# Patient Record
Sex: Female | Born: 1965
Health system: Southern US, Community
[De-identification: ages and names within clinical notes are randomized; demographics above are authoritative.]

## PROBLEM LIST (undated history)

## (undated) DIAGNOSIS — I10 Essential (primary) hypertension: Secondary | ICD-10-CM

## (undated) DIAGNOSIS — G4700 Insomnia, unspecified: Secondary | ICD-10-CM

## (undated) DIAGNOSIS — K219 Gastro-esophageal reflux disease without esophagitis: Secondary | ICD-10-CM

## (undated) DIAGNOSIS — N950 Postmenopausal bleeding: Secondary | ICD-10-CM

## (undated) DIAGNOSIS — E8809 Other disorders of plasma-protein metabolism, not elsewhere classified: Secondary | ICD-10-CM

## (undated) DIAGNOSIS — R7303 Prediabetes: Secondary | ICD-10-CM

## (undated) DIAGNOSIS — E782 Mixed hyperlipidemia: Secondary | ICD-10-CM

## (undated) DIAGNOSIS — N840 Polyp of corpus uteri: Secondary | ICD-10-CM

## (undated) DIAGNOSIS — D259 Leiomyoma of uterus, unspecified: Secondary | ICD-10-CM

---

## 1999-09-25 ENCOUNTER — Other Ambulatory Visit: Admission: RE | Admit: 1999-09-25 | Discharge: 1999-09-25 | Payer: Self-pay | Admitting: Obstetrics and Gynecology

## 2000-10-14 ENCOUNTER — Other Ambulatory Visit: Admission: RE | Admit: 2000-10-14 | Discharge: 2000-10-14 | Payer: Self-pay | Admitting: Obstetrics and Gynecology

## 2001-10-20 ENCOUNTER — Other Ambulatory Visit: Admission: RE | Admit: 2001-10-20 | Discharge: 2001-10-20 | Payer: Self-pay | Admitting: Obstetrics and Gynecology

## 2004-08-28 ENCOUNTER — Ambulatory Visit (HOSPITAL_COMMUNITY): Admission: RE | Admit: 2004-08-28 | Discharge: 2004-08-28 | Payer: Self-pay | Admitting: Obstetrics and Gynecology

## 2004-10-29 ENCOUNTER — Ambulatory Visit (HOSPITAL_BASED_OUTPATIENT_CLINIC_OR_DEPARTMENT_OTHER): Admission: RE | Admit: 2004-10-29 | Discharge: 2004-10-29 | Payer: Self-pay | Admitting: *Deleted

## 2004-10-29 ENCOUNTER — Encounter (INDEPENDENT_AMBULATORY_CARE_PROVIDER_SITE_OTHER): Payer: Self-pay | Admitting: Specialist

## 2004-10-29 HISTORY — PX: MAXILLARY CYST EXCISION: SHX2004

## 2005-09-05 ENCOUNTER — Ambulatory Visit (HOSPITAL_COMMUNITY): Admission: RE | Admit: 2005-09-05 | Discharge: 2005-09-05 | Payer: Self-pay | Admitting: Obstetrics and Gynecology

## 2006-09-12 ENCOUNTER — Ambulatory Visit (HOSPITAL_COMMUNITY): Admission: RE | Admit: 2006-09-12 | Discharge: 2006-09-12 | Payer: Self-pay | Admitting: Obstetrics and Gynecology

## 2007-10-07 ENCOUNTER — Ambulatory Visit (HOSPITAL_COMMUNITY): Admission: RE | Admit: 2007-10-07 | Discharge: 2007-10-07 | Payer: Self-pay | Admitting: Obstetrics and Gynecology

## 2009-10-18 ENCOUNTER — Ambulatory Visit (HOSPITAL_COMMUNITY): Admission: RE | Admit: 2009-10-18 | Discharge: 2009-10-18 | Payer: Self-pay | Admitting: Obstetrics and Gynecology

## 2011-01-28 ENCOUNTER — Other Ambulatory Visit (HOSPITAL_COMMUNITY): Payer: Self-pay | Admitting: Obstetrics and Gynecology

## 2011-01-28 DIAGNOSIS — Z1231 Encounter for screening mammogram for malignant neoplasm of breast: Secondary | ICD-10-CM

## 2011-01-31 ENCOUNTER — Ambulatory Visit (HOSPITAL_COMMUNITY)
Admission: RE | Admit: 2011-01-31 | Discharge: 2011-01-31 | Disposition: A | Discharge status: Home / Self Care | Payer: 59 | Source: Ambulatory Visit | Attending: Obstetrics and Gynecology | Admitting: Obstetrics and Gynecology

## 2011-01-31 DIAGNOSIS — Z1231 Encounter for screening mammogram for malignant neoplasm of breast: Secondary | ICD-10-CM

## 2011-04-12 NOTE — Op Note (Signed)
NAMEPARRISH, Donna Washington               ACCOUNT NO.:  [id number]   MEDICAL RECORD NO.:  [id number]          PATIENT TYPE:  AMB   LOCATION:  DSC                          FACILITY:  [facility]   PHYSICIAN:  Kathy Breach, M.D.      DATE OF BIRTH:  05/31/1966   DATE OF PROCEDURE:  10/29/2004  DATE OF DISCHARGE:                                 OPERATIVE REPORT   PREOPERATIVE DIAGNOSIS:  Right globulomaxillary cyst.   POSTOPERATIVE DIAGNOSIS:  Right globulomaxillary cyst, pending histological  confirmation.   PROCEDURE:  Excision right globulomaxillary cyst.   SURGEON:  Kathy Breach, M.D.   ANESTHESIA:  General orotracheal.   DESCRIPTION OF PROCEDURE:  With the patient under general orotracheal  anesthesia, the face was prepped and draped in a sterile fashion.  Then 1%  Xylocaine, 1:100,000 epinephrine was infiltrated superficially in the labial  sulcus to the right of the midline.  The patient presented with a cystic  mass, ballooning into the lateral and floor of the aspect of the right nasal  vestibule with palpable cyst underlying the attachment of the nasal ala to  the cheek, and positioned of a globulomaxillary cyst forming along the  fusion line between the pre-maxilla and the maxilla.   A curved incision in the height of the labial sulcus from the midline,  extending to the right was made, down through mucosa and down on through the  periosteum to the face of the maxilla. The very inferior extent of the cyst  encountered, ruptured and serous effusion fluid escaped from the cystic  mass.  The interior of the cyst was identifiable in the cyst wall and  resected off of the face of the maxilla, into the nasal vestibule and down  into the floor of the nasal vestibule. It was sharply and bluntly dissected  circumferentially and removed in toto.   Inspection of the cyst at the completion of the excision appeared to have an  entire excision of this smooth lying, interior intact cyst other than  the  ruptured area at the inferior aspect.  Hemostasis throughout the procedure  was obtained with light touch electrocautery.  Through-and-through defect  into the mucosa over the area over the piriform aperture unavoidable.  The  intranasal defect not readily closable.  The Caldwell-Luc type labial sulcus  incision was closed with deeply placed into 3-0 chromic catgut sutures.  A  cotton ball impregnated with Bacitracin was inserted in the nasal vestibule.  Nasopharynx suctioned clear of any blood.  The patient tolerated the  procedure well, taken to the recovery room in stable, general condition.  Estimated blood loss for the procedure was less than 30 cc.       JGL/MEDQ  D:  10/29/2004  T:  10/29/2004  Job:  [id number]

## 2012-04-13 ENCOUNTER — Other Ambulatory Visit (HOSPITAL_COMMUNITY): Payer: Self-pay | Admitting: Obstetrics and Gynecology

## 2012-04-13 DIAGNOSIS — Z1231 Encounter for screening mammogram for malignant neoplasm of breast: Secondary | ICD-10-CM

## 2012-04-15 ENCOUNTER — Ambulatory Visit (HOSPITAL_COMMUNITY)
Admission: RE | Admit: 2012-04-15 | Discharge: 2012-04-15 | Disposition: A | Discharge status: Home / Self Care | Payer: 59 | Source: Ambulatory Visit | Attending: Obstetrics and Gynecology | Admitting: Obstetrics and Gynecology

## 2012-04-15 DIAGNOSIS — Z1231 Encounter for screening mammogram for malignant neoplasm of breast: Secondary | ICD-10-CM | POA: Insufficient documentation

## 2012-04-23 ENCOUNTER — Other Ambulatory Visit: Payer: Self-pay | Admitting: Obstetrics and Gynecology

## 2012-04-23 DIAGNOSIS — R928 Other abnormal and inconclusive findings on diagnostic imaging of breast: Secondary | ICD-10-CM

## 2012-04-24 ENCOUNTER — Ambulatory Visit
Admission: RE | Admit: 2012-04-24 | Discharge: 2012-04-24 | Disposition: A | Discharge status: Home / Self Care | Payer: 59 | Source: Ambulatory Visit | Attending: Obstetrics and Gynecology | Admitting: Obstetrics and Gynecology

## 2012-04-24 DIAGNOSIS — R928 Other abnormal and inconclusive findings on diagnostic imaging of breast: Secondary | ICD-10-CM

## 2012-09-19 ENCOUNTER — Emergency Department (HOSPITAL_COMMUNITY): Payer: No Typology Code available for payment source

## 2012-09-19 ENCOUNTER — Encounter (HOSPITAL_COMMUNITY): Payer: Self-pay

## 2012-09-19 ENCOUNTER — Emergency Department (HOSPITAL_COMMUNITY)
Admission: EM | Admit: 2012-09-19 | Discharge: 2012-09-19 | Disposition: A | Discharge status: Home / Self Care | Payer: No Typology Code available for payment source | Attending: Emergency Medicine | Admitting: Emergency Medicine

## 2012-09-19 DIAGNOSIS — I10 Essential (primary) hypertension: Secondary | ICD-10-CM | POA: Insufficient documentation

## 2012-09-19 DIAGNOSIS — S8990XA Unspecified injury of unspecified lower leg, initial encounter: Secondary | ICD-10-CM | POA: Insufficient documentation

## 2012-09-19 DIAGNOSIS — Y9389 Activity, other specified: Secondary | ICD-10-CM | POA: Insufficient documentation

## 2012-09-19 DIAGNOSIS — Z79899 Other long term (current) drug therapy: Secondary | ICD-10-CM | POA: Insufficient documentation

## 2012-09-19 DIAGNOSIS — Z7982 Long term (current) use of aspirin: Secondary | ICD-10-CM | POA: Insufficient documentation

## 2012-09-19 DIAGNOSIS — S46909A Unspecified injury of unspecified muscle, fascia and tendon at shoulder and upper arm level, unspecified arm, initial encounter: Secondary | ICD-10-CM | POA: Insufficient documentation

## 2012-09-19 DIAGNOSIS — S4980XA Other specified injuries of shoulder and upper arm, unspecified arm, initial encounter: Secondary | ICD-10-CM | POA: Insufficient documentation

## 2012-09-19 HISTORY — DX: Essential (primary) hypertension: I10

## 2012-09-19 MED ORDER — OXYCODONE-ACETAMINOPHEN 5-325 MG PO TABS
1.0000 | ORAL_TABLET | Freq: Once | ORAL | Status: AC
  Administered 2012-09-19: 1 via ORAL
  Filled 2012-09-19: qty 1

## 2012-09-19 MED ORDER — OXYCODONE-ACETAMINOPHEN 5-325 MG PO TABS: 2.0000 | ORAL_TABLET | ORAL | Status: DC | PRN

## 2012-09-19 NOTE — Discharge Instructions (Signed)
Your x-rays do not show any broken bones or dislocations.  Use ibuprofen 600 mg every 6 hours for the next 48 hours for pain.  Use Percocet for more severe pain.  Followup with your Dr. if your symptoms.  Last more than 2-3 days.  Return for worse or uncontrolled symptoms
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
## 2012-09-19 NOTE — ED Notes (Signed)
Ice pack given to pt.
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
## 2013-04-27 ENCOUNTER — Other Ambulatory Visit (HOSPITAL_COMMUNITY): Payer: Self-pay | Admitting: Obstetrics and Gynecology

## 2013-04-27 DIAGNOSIS — Z1231 Encounter for screening mammogram for malignant neoplasm of breast: Secondary | ICD-10-CM

## 2013-05-05 ENCOUNTER — Ambulatory Visit (HOSPITAL_COMMUNITY)
Admission: RE | Admit: 2013-05-05 | Discharge: 2013-05-05 | Disposition: A | Discharge status: Home / Self Care | Payer: 59 | Source: Ambulatory Visit | Attending: Obstetrics and Gynecology | Admitting: Obstetrics and Gynecology

## 2013-05-05 DIAGNOSIS — Z1231 Encounter for screening mammogram for malignant neoplasm of breast: Secondary | ICD-10-CM | POA: Insufficient documentation

## 2014-05-25 ENCOUNTER — Other Ambulatory Visit: Payer: Self-pay | Admitting: Obstetrics and Gynecology

## 2014-05-25 DIAGNOSIS — R928 Other abnormal and inconclusive findings on diagnostic imaging of breast: Secondary | ICD-10-CM

## 2014-06-06 ENCOUNTER — Ambulatory Visit
Admission: RE | Admit: 2014-06-06 | Discharge: 2014-06-06 | Disposition: A | Discharge status: Home / Self Care | Payer: 59 | Source: Ambulatory Visit | Attending: Obstetrics and Gynecology | Admitting: Obstetrics and Gynecology

## 2014-06-06 ENCOUNTER — Encounter (INDEPENDENT_AMBULATORY_CARE_PROVIDER_SITE_OTHER): Payer: Self-pay

## 2014-06-06 DIAGNOSIS — R928 Other abnormal and inconclusive findings on diagnostic imaging of breast: Secondary | ICD-10-CM

## 2015-06-15 ENCOUNTER — Other Ambulatory Visit: Payer: Self-pay | Admitting: Obstetrics and Gynecology

## 2015-06-15 DIAGNOSIS — R928 Other abnormal and inconclusive findings on diagnostic imaging of breast: Secondary | ICD-10-CM

## 2015-06-20 ENCOUNTER — Other Ambulatory Visit: Payer: 59

## 2015-06-22 ENCOUNTER — Ambulatory Visit
Admission: RE | Admit: 2015-06-22 | Discharge: 2015-06-22 | Disposition: A | Discharge status: Home / Self Care | Payer: 59 | Source: Ambulatory Visit | Attending: Obstetrics and Gynecology | Admitting: Obstetrics and Gynecology

## 2015-06-22 DIAGNOSIS — R928 Other abnormal and inconclusive findings on diagnostic imaging of breast: Secondary | ICD-10-CM

## 2015-12-05 DIAGNOSIS — M9909 Segmental and somatic dysfunction of abdomen and other regions: Secondary | ICD-10-CM | POA: Diagnosis not present

## 2015-12-05 DIAGNOSIS — I10 Essential (primary) hypertension: Secondary | ICD-10-CM | POA: Diagnosis not present

## 2015-12-05 DIAGNOSIS — S161XXA Strain of muscle, fascia and tendon at neck level, initial encounter: Secondary | ICD-10-CM | POA: Diagnosis not present

## 2015-12-11 MED FILL — LISINOPRIL-HCTZ 20-25 MG TA: 20-25 | 90 days supply | Qty: 90 | Fill #0

## 2016-01-18 DIAGNOSIS — S161XXA Strain of muscle, fascia and tendon at neck level, initial encounter: Secondary | ICD-10-CM | POA: Diagnosis not present

## 2016-01-18 DIAGNOSIS — M9909 Segmental and somatic dysfunction of abdomen and other regions: Secondary | ICD-10-CM | POA: Diagnosis not present

## 2016-01-18 DIAGNOSIS — I10 Essential (primary) hypertension: Secondary | ICD-10-CM | POA: Diagnosis not present

## 2016-01-18 DIAGNOSIS — E785 Hyperlipidemia, unspecified: Secondary | ICD-10-CM | POA: Diagnosis not present

## 2016-01-25 MED FILL — VASCEPA 1 GM CAPSULE: 1 | 30 days supply | Qty: 120 | Fill #1

## 2016-02-29 DIAGNOSIS — I10 Essential (primary) hypertension: Secondary | ICD-10-CM | POA: Diagnosis not present

## 2016-02-29 DIAGNOSIS — M9909 Segmental and somatic dysfunction of abdomen and other regions: Secondary | ICD-10-CM | POA: Diagnosis not present

## 2016-02-29 DIAGNOSIS — S161XXA Strain of muscle, fascia and tendon at neck level, initial encounter: Secondary | ICD-10-CM | POA: Diagnosis not present

## 2016-03-20 MED FILL — VASCEPA 1 GM CAPSULE: 1 | 30 days supply | Qty: 120 | Fill #2

## 2016-03-20 MED FILL — LISINOPRIL-HCTZ 20-25 MG TA: 20-25 | 90 days supply | Qty: 90 | Fill #1

## 2016-04-24 DIAGNOSIS — I10 Essential (primary) hypertension: Secondary | ICD-10-CM | POA: Diagnosis not present

## 2016-04-24 DIAGNOSIS — M9909 Segmental and somatic dysfunction of abdomen and other regions: Secondary | ICD-10-CM | POA: Diagnosis not present

## 2016-04-24 DIAGNOSIS — S161XXA Strain of muscle, fascia and tendon at neck level, initial encounter: Secondary | ICD-10-CM | POA: Diagnosis not present

## 2016-06-20 ENCOUNTER — Other Ambulatory Visit: Payer: Self-pay | Admitting: Obstetrics and Gynecology

## 2016-06-20 DIAGNOSIS — Z1231 Encounter for screening mammogram for malignant neoplasm of breast: Secondary | ICD-10-CM

## 2016-06-21 MED FILL — VASCEPA 1 GM CAPSULE: 1 | 30 days supply | Qty: 120 | Fill #3

## 2016-06-21 MED FILL — LISINOPRIL-HCTZ 20-25 MG TA: 20-25 | 90 days supply | Qty: 90 | Fill #2

## 2016-06-25 DIAGNOSIS — M9909 Segmental and somatic dysfunction of abdomen and other regions: Secondary | ICD-10-CM | POA: Diagnosis not present

## 2016-06-25 DIAGNOSIS — E785 Hyperlipidemia, unspecified: Secondary | ICD-10-CM | POA: Diagnosis not present

## 2016-06-25 DIAGNOSIS — I10 Essential (primary) hypertension: Secondary | ICD-10-CM | POA: Diagnosis not present

## 2016-06-25 DIAGNOSIS — S161XXA Strain of muscle, fascia and tendon at neck level, initial encounter: Secondary | ICD-10-CM | POA: Diagnosis not present

## 2016-07-09 ENCOUNTER — Ambulatory Visit
Admission: RE | Admit: 2016-07-09 | Discharge: 2016-07-09 | Disposition: A | Discharge status: Home / Self Care | Payer: 59 | Source: Ambulatory Visit | Attending: Obstetrics and Gynecology | Admitting: Obstetrics and Gynecology

## 2016-07-09 DIAGNOSIS — Z1231 Encounter for screening mammogram for malignant neoplasm of breast: Secondary | ICD-10-CM | POA: Diagnosis not present

## 2016-08-23 MED FILL — VASCEPA 1 GM CAPSULE: 1 | 30 days supply | Qty: 120 | Fill #4

## 2016-09-03 DIAGNOSIS — S161XXA Strain of muscle, fascia and tendon at neck level, initial encounter: Secondary | ICD-10-CM | POA: Diagnosis not present

## 2016-09-03 DIAGNOSIS — E785 Hyperlipidemia, unspecified: Secondary | ICD-10-CM | POA: Diagnosis not present

## 2016-09-03 DIAGNOSIS — G4726 Circadian rhythm sleep disorder, shift work type: Secondary | ICD-10-CM | POA: Diagnosis not present

## 2016-09-03 DIAGNOSIS — I10 Essential (primary) hypertension: Secondary | ICD-10-CM | POA: Diagnosis not present

## 2016-09-03 DIAGNOSIS — M9909 Segmental and somatic dysfunction of abdomen and other regions: Secondary | ICD-10-CM | POA: Diagnosis not present

## 2016-09-20 MED FILL — LISINOPRIL-HCTZ 20-25 MG TA: 20-25 | 90 days supply | Qty: 90 | Fill #3

## 2016-10-10 DIAGNOSIS — Z79899 Other long term (current) drug therapy: Secondary | ICD-10-CM | POA: Diagnosis not present

## 2016-10-10 DIAGNOSIS — M9909 Segmental and somatic dysfunction of abdomen and other regions: Secondary | ICD-10-CM | POA: Diagnosis not present

## 2016-10-10 DIAGNOSIS — S161XXA Strain of muscle, fascia and tendon at neck level, initial encounter: Secondary | ICD-10-CM | POA: Diagnosis not present

## 2016-10-10 DIAGNOSIS — E785 Hyperlipidemia, unspecified: Secondary | ICD-10-CM | POA: Diagnosis not present

## 2016-10-10 DIAGNOSIS — I10 Essential (primary) hypertension: Secondary | ICD-10-CM | POA: Diagnosis not present

## 2016-10-10 DIAGNOSIS — J40 Bronchitis, not specified as acute or chronic: Secondary | ICD-10-CM | POA: Diagnosis not present

## 2016-11-20 MED FILL — VASCEPA 1 GM CAPSULE: 1 | 30 days supply | Qty: 120 | Fill #0

## 2016-11-21 DIAGNOSIS — E785 Hyperlipidemia, unspecified: Secondary | ICD-10-CM | POA: Diagnosis not present

## 2016-11-21 DIAGNOSIS — M9909 Segmental and somatic dysfunction of abdomen and other regions: Secondary | ICD-10-CM | POA: Diagnosis not present

## 2016-11-21 DIAGNOSIS — S161XXA Strain of muscle, fascia and tendon at neck level, initial encounter: Secondary | ICD-10-CM | POA: Diagnosis not present

## 2016-11-21 DIAGNOSIS — I10 Essential (primary) hypertension: Secondary | ICD-10-CM | POA: Diagnosis not present

## 2016-11-21 DIAGNOSIS — G4726 Circadian rhythm sleep disorder, shift work type: Secondary | ICD-10-CM | POA: Diagnosis not present

## 2016-12-24 MED FILL — LISINOPRIL-HCTZ 20-25 MG TA: 20-25 | 90 days supply | Qty: 90 | Fill #0

## 2017-01-16 DIAGNOSIS — M9901 Segmental and somatic dysfunction of cervical region: Secondary | ICD-10-CM | POA: Diagnosis not present

## 2017-01-16 DIAGNOSIS — Z6839 Body mass index (BMI) 39.0-39.9, adult: Secondary | ICD-10-CM | POA: Diagnosis not present

## 2017-01-16 DIAGNOSIS — I10 Essential (primary) hypertension: Secondary | ICD-10-CM | POA: Diagnosis not present

## 2017-01-16 DIAGNOSIS — S161XXS Strain of muscle, fascia and tendon at neck level, sequela: Secondary | ICD-10-CM | POA: Diagnosis not present

## 2017-01-22 MED FILL — VASCEPA 1 GM CAPSULE: 1 | 30 days supply | Qty: 120 | Fill #1

## 2017-02-25 DIAGNOSIS — I10 Essential (primary) hypertension: Secondary | ICD-10-CM | POA: Diagnosis not present

## 2017-02-25 DIAGNOSIS — E782 Mixed hyperlipidemia: Secondary | ICD-10-CM | POA: Diagnosis not present

## 2017-02-25 DIAGNOSIS — Z6838 Body mass index (BMI) 38.0-38.9, adult: Secondary | ICD-10-CM | POA: Diagnosis not present

## 2017-02-25 DIAGNOSIS — S161XXS Strain of muscle, fascia and tendon at neck level, sequela: Secondary | ICD-10-CM | POA: Diagnosis not present

## 2017-02-25 DIAGNOSIS — Z6839 Body mass index (BMI) 39.0-39.9, adult: Secondary | ICD-10-CM | POA: Diagnosis not present

## 2017-02-25 DIAGNOSIS — M9901 Segmental and somatic dysfunction of cervical region: Secondary | ICD-10-CM | POA: Diagnosis not present

## 2017-03-05 DIAGNOSIS — M9901 Segmental and somatic dysfunction of cervical region: Secondary | ICD-10-CM | POA: Diagnosis not present

## 2017-03-05 DIAGNOSIS — I10 Essential (primary) hypertension: Secondary | ICD-10-CM | POA: Diagnosis not present

## 2017-03-05 DIAGNOSIS — E782 Mixed hyperlipidemia: Secondary | ICD-10-CM | POA: Diagnosis not present

## 2017-03-05 DIAGNOSIS — Z6839 Body mass index (BMI) 39.0-39.9, adult: Secondary | ICD-10-CM | POA: Diagnosis not present

## 2017-03-05 DIAGNOSIS — S161XXS Strain of muscle, fascia and tendon at neck level, sequela: Secondary | ICD-10-CM | POA: Diagnosis not present

## 2017-03-21 MED FILL — LISINOPRIL-HCTZ 20-25 MG TA: 20-25 | 90 days supply | Qty: 90 | Fill #1

## 2017-03-21 MED FILL — VASCEPA 1 GM CAPSULE: 1 | 30 days supply | Qty: 120 | Fill #2

## 2017-05-13 MED FILL — VASCEPA 1 GM CAPSULE: 1 | 30 days supply | Qty: 120 | Fill #3

## 2017-05-29 DIAGNOSIS — F439 Reaction to severe stress, unspecified: Secondary | ICD-10-CM | POA: Diagnosis not present

## 2017-05-29 DIAGNOSIS — M9901 Segmental and somatic dysfunction of cervical region: Secondary | ICD-10-CM | POA: Diagnosis not present

## 2017-05-29 DIAGNOSIS — I10 Essential (primary) hypertension: Secondary | ICD-10-CM | POA: Diagnosis not present

## 2017-05-29 DIAGNOSIS — S161XXS Strain of muscle, fascia and tendon at neck level, sequela: Secondary | ICD-10-CM | POA: Diagnosis not present

## 2017-06-06 ENCOUNTER — Other Ambulatory Visit: Payer: Self-pay | Admitting: Obstetrics and Gynecology

## 2017-06-06 DIAGNOSIS — Z1231 Encounter for screening mammogram for malignant neoplasm of breast: Secondary | ICD-10-CM

## 2017-06-23 MED FILL — VASCEPA 1 GM CAPSULE: 1 | 30 days supply | Qty: 120 | Fill #4

## 2017-06-23 MED FILL — LISINOPRIL-HCTZ 20-25 MG TA: 20-25 | 90 days supply | Qty: 90 | Fill #2

## 2017-07-23 ENCOUNTER — Ambulatory Visit
Admission: RE | Admit: 2017-07-23 | Discharge: 2017-07-23 | Disposition: A | Discharge status: Home / Self Care | Payer: 59 | Source: Ambulatory Visit | Attending: Obstetrics and Gynecology | Admitting: Obstetrics and Gynecology

## 2017-07-23 DIAGNOSIS — Z1231 Encounter for screening mammogram for malignant neoplasm of breast: Secondary | ICD-10-CM

## 2017-08-26 DIAGNOSIS — S161XXS Strain of muscle, fascia and tendon at neck level, sequela: Secondary | ICD-10-CM | POA: Diagnosis not present

## 2017-08-26 DIAGNOSIS — M9901 Segmental and somatic dysfunction of cervical region: Secondary | ICD-10-CM | POA: Diagnosis not present

## 2017-08-26 DIAGNOSIS — I10 Essential (primary) hypertension: Secondary | ICD-10-CM | POA: Diagnosis not present

## 2017-08-26 MED FILL — VASCEPA 1 GM CAPSULE: 1 | 30 days supply | Qty: 120 | Fill #5

## 2017-09-22 MED FILL — LISINOPRIL-HCTZ 20-25 MG TA: 20-25 | 90 days supply | Qty: 90 | Fill #3

## 2017-10-14 DIAGNOSIS — Z1389 Encounter for screening for other disorder: Secondary | ICD-10-CM | POA: Diagnosis not present

## 2017-10-14 DIAGNOSIS — Z1151 Encounter for screening for human papillomavirus (HPV): Secondary | ICD-10-CM | POA: Diagnosis not present

## 2017-10-14 DIAGNOSIS — Z130 Encounter for screening for diseases of the blood and blood-forming organs and certain disorders involving the immune mechanism: Secondary | ICD-10-CM | POA: Diagnosis not present

## 2017-10-14 DIAGNOSIS — Z124 Encounter for screening for malignant neoplasm of cervix: Secondary | ICD-10-CM | POA: Diagnosis not present

## 2017-10-14 DIAGNOSIS — Z01419 Encounter for gynecological examination (general) (routine) without abnormal findings: Secondary | ICD-10-CM | POA: Diagnosis not present

## 2017-10-27 DIAGNOSIS — Z6838 Body mass index (BMI) 38.0-38.9, adult: Secondary | ICD-10-CM | POA: Diagnosis not present

## 2017-10-27 DIAGNOSIS — Z6839 Body mass index (BMI) 39.0-39.9, adult: Secondary | ICD-10-CM | POA: Diagnosis not present

## 2017-10-27 DIAGNOSIS — M9901 Segmental and somatic dysfunction of cervical region: Secondary | ICD-10-CM | POA: Diagnosis not present

## 2017-10-27 DIAGNOSIS — E782 Mixed hyperlipidemia: Secondary | ICD-10-CM | POA: Diagnosis not present

## 2017-10-27 DIAGNOSIS — S161XXS Strain of muscle, fascia and tendon at neck level, sequela: Secondary | ICD-10-CM | POA: Diagnosis not present

## 2017-10-27 DIAGNOSIS — I10 Essential (primary) hypertension: Secondary | ICD-10-CM | POA: Diagnosis not present

## 2017-11-26 MED FILL — predniSONE 20 MG TABS: 20 | 6 days supply | Qty: 21 | Fill #0

## 2017-11-26 MED FILL — HYDROCODONE-HOMATROPINE SYR: 5-1.5 | 5 days supply | Qty: 100 | Fill #0

## 2017-12-02 MED FILL — AMOXICILLIN 500 MG CAPSULE: 500 | 10 days supply | Qty: 30 | Fill #0

## 2017-12-30 DIAGNOSIS — Z6839 Body mass index (BMI) 39.0-39.9, adult: Secondary | ICD-10-CM | POA: Diagnosis not present

## 2017-12-30 DIAGNOSIS — J40 Bronchitis, not specified as acute or chronic: Secondary | ICD-10-CM | POA: Diagnosis not present

## 2017-12-30 DIAGNOSIS — Z6838 Body mass index (BMI) 38.0-38.9, adult: Secondary | ICD-10-CM | POA: Diagnosis not present

## 2017-12-30 DIAGNOSIS — I10 Essential (primary) hypertension: Secondary | ICD-10-CM | POA: Diagnosis not present

## 2017-12-30 DIAGNOSIS — M9901 Segmental and somatic dysfunction of cervical region: Secondary | ICD-10-CM | POA: Diagnosis not present

## 2017-12-30 DIAGNOSIS — E782 Mixed hyperlipidemia: Secondary | ICD-10-CM | POA: Diagnosis not present

## 2017-12-30 DIAGNOSIS — S161XXS Strain of muscle, fascia and tendon at neck level, sequela: Secondary | ICD-10-CM | POA: Diagnosis not present

## 2017-12-30 MED FILL — VASCEPA 1 GM CAPSULE: 1 | 90 days supply | Qty: 360 | Fill #0

## 2017-12-30 MED FILL — LISINOPRIL-HCTZ 20-25 MG TA: 20-25 | 90 days supply | Qty: 90 | Fill #0

## 2017-12-31 MED FILL — OSELTAMIVIR PHOSPHATE 75 MG: 75 | 5 days supply | Qty: 10 | Fill #0

## 2018-03-31 DIAGNOSIS — I10 Essential (primary) hypertension: Secondary | ICD-10-CM | POA: Diagnosis not present

## 2018-03-31 DIAGNOSIS — E782 Mixed hyperlipidemia: Secondary | ICD-10-CM | POA: Diagnosis not present

## 2018-03-31 DIAGNOSIS — J40 Bronchitis, not specified as acute or chronic: Secondary | ICD-10-CM | POA: Diagnosis not present

## 2018-03-31 DIAGNOSIS — M9901 Segmental and somatic dysfunction of cervical region: Secondary | ICD-10-CM | POA: Diagnosis not present

## 2018-04-03 MED FILL — VASCEPA 1 GM CAPSULE: 1 | 90 days supply | Qty: 360 | Fill #1

## 2018-04-03 MED FILL — LISINOPRIL-HCTZ 20-25 MG TA: 20-25 | 90 days supply | Qty: 90 | Fill #1

## 2018-04-09 DIAGNOSIS — Z1211 Encounter for screening for malignant neoplasm of colon: Secondary | ICD-10-CM | POA: Diagnosis not present

## 2018-04-09 DIAGNOSIS — Z1212 Encounter for screening for malignant neoplasm of rectum: Secondary | ICD-10-CM | POA: Diagnosis not present

## 2018-05-14 DIAGNOSIS — I10 Essential (primary) hypertension: Secondary | ICD-10-CM | POA: Diagnosis not present

## 2018-05-14 DIAGNOSIS — Z0000 Encounter for general adult medical examination without abnormal findings: Secondary | ICD-10-CM | POA: Diagnosis not present

## 2018-06-26 MED FILL — LISINOPRIL-HCTZ 20-25 MG TA: 20-25 | 90 days supply | Qty: 90 | Fill #2

## 2018-06-26 MED FILL — VASCEPA 1 GM CAPSULE: 1 | 90 days supply | Qty: 360 | Fill #2

## 2018-09-15 DIAGNOSIS — M9901 Segmental and somatic dysfunction of cervical region: Secondary | ICD-10-CM | POA: Diagnosis not present

## 2018-09-15 DIAGNOSIS — M9902 Segmental and somatic dysfunction of thoracic region: Secondary | ICD-10-CM | POA: Diagnosis not present

## 2018-09-15 DIAGNOSIS — E782 Mixed hyperlipidemia: Secondary | ICD-10-CM | POA: Diagnosis not present

## 2018-09-15 DIAGNOSIS — S161XXS Strain of muscle, fascia and tendon at neck level, sequela: Secondary | ICD-10-CM | POA: Diagnosis not present

## 2018-09-15 DIAGNOSIS — I10 Essential (primary) hypertension: Secondary | ICD-10-CM | POA: Diagnosis not present

## 2018-09-15 DIAGNOSIS — M9903 Segmental and somatic dysfunction of lumbar region: Secondary | ICD-10-CM | POA: Diagnosis not present

## 2018-10-27 MED FILL — VASCEPA 1 GM CAPSULE: 1 | 90 days supply | Qty: 360 | Fill #3

## 2018-10-27 MED FILL — LISINOPRIL-HCTZ 20-25 MG TA: 20-25 | 90 days supply | Qty: 90 | Fill #3

## 2018-12-02 DIAGNOSIS — M9902 Segmental and somatic dysfunction of thoracic region: Secondary | ICD-10-CM | POA: Diagnosis not present

## 2018-12-02 DIAGNOSIS — M9903 Segmental and somatic dysfunction of lumbar region: Secondary | ICD-10-CM | POA: Diagnosis not present

## 2018-12-02 DIAGNOSIS — M9901 Segmental and somatic dysfunction of cervical region: Secondary | ICD-10-CM | POA: Diagnosis not present

## 2018-12-02 DIAGNOSIS — E782 Mixed hyperlipidemia: Secondary | ICD-10-CM | POA: Diagnosis not present

## 2018-12-02 DIAGNOSIS — S161XXS Strain of muscle, fascia and tendon at neck level, sequela: Secondary | ICD-10-CM | POA: Diagnosis not present

## 2018-12-02 DIAGNOSIS — I10 Essential (primary) hypertension: Secondary | ICD-10-CM | POA: Diagnosis not present

## 2019-01-25 MED FILL — LISINOPRIL-HCTZ 20-25 MG TA: 20-25 | 90 days supply | Qty: 90 | Fill #0

## 2019-01-28 ENCOUNTER — Other Ambulatory Visit: Payer: Self-pay | Admitting: Obstetrics and Gynecology

## 2019-01-28 DIAGNOSIS — Z1231 Encounter for screening mammogram for malignant neoplasm of breast: Secondary | ICD-10-CM

## 2019-02-01 MED FILL — OSELTAMIVIR PHOSPHATE 75 MG: 75 | 7 days supply | Qty: 7 | Fill #0

## 2019-02-24 ENCOUNTER — Ambulatory Visit: Payer: 59

## 2019-03-04 MED FILL — VASCEPA 1 GM CAPSULE: 1 | 90 days supply | Qty: 360 | Fill #0

## 2019-05-10 ENCOUNTER — Other Ambulatory Visit: Payer: Self-pay

## 2019-05-10 ENCOUNTER — Ambulatory Visit
Admission: RE | Admit: 2019-05-10 | Discharge: 2019-05-10 | Disposition: A | Discharge status: Home / Self Care | Payer: 59 | Source: Ambulatory Visit | Attending: Obstetrics and Gynecology | Admitting: Obstetrics and Gynecology

## 2019-05-10 DIAGNOSIS — Z1231 Encounter for screening mammogram for malignant neoplasm of breast: Secondary | ICD-10-CM | POA: Diagnosis not present

## 2019-05-12 ENCOUNTER — Other Ambulatory Visit: Payer: Self-pay | Admitting: Obstetrics and Gynecology

## 2019-05-12 DIAGNOSIS — R921 Mammographic calcification found on diagnostic imaging of breast: Secondary | ICD-10-CM

## 2019-05-14 MED FILL — LISINOPRIL-HCTZ 20-25 MG TA: 20-25 | 90 days supply | Qty: 90 | Fill #1

## 2019-05-19 ENCOUNTER — Other Ambulatory Visit: Payer: Self-pay

## 2019-05-19 ENCOUNTER — Other Ambulatory Visit: Payer: Self-pay | Admitting: Obstetrics and Gynecology

## 2019-05-19 ENCOUNTER — Ambulatory Visit
Admission: RE | Admit: 2019-05-19 | Discharge: 2019-05-19 | Disposition: A | Discharge status: Home / Self Care | Payer: 59 | Source: Ambulatory Visit | Attending: Obstetrics and Gynecology | Admitting: Obstetrics and Gynecology

## 2019-05-19 DIAGNOSIS — R921 Mammographic calcification found on diagnostic imaging of breast: Secondary | ICD-10-CM

## 2019-05-25 DIAGNOSIS — I10 Essential (primary) hypertension: Secondary | ICD-10-CM | POA: Diagnosis not present

## 2019-05-25 DIAGNOSIS — M9901 Segmental and somatic dysfunction of cervical region: Secondary | ICD-10-CM | POA: Diagnosis not present

## 2019-05-25 DIAGNOSIS — S161XXS Strain of muscle, fascia and tendon at neck level, sequela: Secondary | ICD-10-CM | POA: Diagnosis not present

## 2019-05-25 DIAGNOSIS — M9902 Segmental and somatic dysfunction of thoracic region: Secondary | ICD-10-CM | POA: Diagnosis not present

## 2019-05-25 DIAGNOSIS — E782 Mixed hyperlipidemia: Secondary | ICD-10-CM | POA: Diagnosis not present

## 2019-05-25 DIAGNOSIS — M9903 Segmental and somatic dysfunction of lumbar region: Secondary | ICD-10-CM | POA: Diagnosis not present

## 2019-06-18 DIAGNOSIS — E782 Mixed hyperlipidemia: Secondary | ICD-10-CM | POA: Diagnosis not present

## 2019-06-18 DIAGNOSIS — I10 Essential (primary) hypertension: Secondary | ICD-10-CM | POA: Diagnosis not present

## 2019-08-12 MED FILL — LISINOPRIL-HCTZ 20-25 MG TA: 20-25 | 90 days supply | Qty: 90 | Fill #2

## 2019-08-12 MED FILL — VASCEPA 1 GM CAPSULE: 1 | 90 days supply | Qty: 360 | Fill #0

## 2019-09-23 DIAGNOSIS — Z130 Encounter for screening for diseases of the blood and blood-forming organs and certain disorders involving the immune mechanism: Secondary | ICD-10-CM | POA: Diagnosis not present

## 2019-09-23 DIAGNOSIS — Z1389 Encounter for screening for other disorder: Secondary | ICD-10-CM | POA: Diagnosis not present

## 2019-09-23 DIAGNOSIS — Z6841 Body Mass Index (BMI) 40.0 and over, adult: Secondary | ICD-10-CM | POA: Diagnosis not present

## 2019-09-23 DIAGNOSIS — Z01419 Encounter for gynecological examination (general) (routine) without abnormal findings: Secondary | ICD-10-CM | POA: Diagnosis not present

## 2019-10-04 MED FILL — OMRON 3 SERIES BP MONITOR D: 90 days supply | Qty: 1 | Fill #0

## 2019-10-05 DIAGNOSIS — H5203 Hypermetropia, bilateral: Secondary | ICD-10-CM | POA: Diagnosis not present

## 2019-11-22 ENCOUNTER — Other Ambulatory Visit: Payer: Self-pay | Admitting: Obstetrics and Gynecology

## 2019-11-22 ENCOUNTER — Other Ambulatory Visit: Payer: Self-pay

## 2019-11-22 ENCOUNTER — Ambulatory Visit
Admission: RE | Admit: 2019-11-22 | Discharge: 2019-11-22 | Disposition: A | Discharge status: Home / Self Care | Payer: 59 | Source: Ambulatory Visit | Attending: Obstetrics and Gynecology | Admitting: Obstetrics and Gynecology

## 2019-11-22 DIAGNOSIS — R921 Mammographic calcification found on diagnostic imaging of breast: Secondary | ICD-10-CM

## 2019-11-22 MED FILL — LISINOPRIL-HCTZ 20-25 MG TA: 20-25 | 90 days supply | Qty: 90 | Fill #3

## 2020-02-14 DIAGNOSIS — H43811 Vitreous degeneration, right eye: Secondary | ICD-10-CM | POA: Diagnosis not present

## 2020-02-28 DIAGNOSIS — H43811 Vitreous degeneration, right eye: Secondary | ICD-10-CM | POA: Diagnosis not present

## 2020-03-02 MED FILL — LISINOPRIL-HCTZ 20-25 MG TA: 20-25 | 90 days supply | Qty: 90 | Fill #0

## 2020-05-10 ENCOUNTER — Ambulatory Visit
Admission: RE | Admit: 2020-05-10 | Discharge: 2020-05-10 | Disposition: A | Discharge status: Home / Self Care | Payer: 59 | Source: Ambulatory Visit | Attending: Obstetrics and Gynecology | Admitting: Obstetrics and Gynecology

## 2020-05-10 ENCOUNTER — Other Ambulatory Visit: Payer: Self-pay

## 2020-05-10 DIAGNOSIS — R921 Mammographic calcification found on diagnostic imaging of breast: Secondary | ICD-10-CM | POA: Diagnosis not present

## 2020-05-30 MED FILL — LISINOPRIL-HCTZ 20-25 MG TA: 20-25 | 90 days supply | Qty: 90 | Fill #0

## 2020-06-07 DIAGNOSIS — Z0000 Encounter for general adult medical examination without abnormal findings: Secondary | ICD-10-CM | POA: Diagnosis not present

## 2020-06-07 DIAGNOSIS — I10 Essential (primary) hypertension: Secondary | ICD-10-CM | POA: Diagnosis not present

## 2020-06-07 DIAGNOSIS — E782 Mixed hyperlipidemia: Secondary | ICD-10-CM | POA: Diagnosis not present

## 2020-06-07 DIAGNOSIS — R7309 Other abnormal glucose: Secondary | ICD-10-CM | POA: Diagnosis not present

## 2020-08-10 MED FILL — VASCEPA 1 GM CAPSULE: 1 | 90 days supply | Qty: 360 | Fill #1

## 2020-09-04 IMAGING — MG DIGITAL DIAGNOSTIC BILAT W/ TOMO W/ CAD
6 of 10 series · 6 of 26 positions shown · non-contrast
Comparison: Previous exam(s).

CLINICAL DATA: Patient for short-term follow-up probably benign
left breast calcifications.

EXAM:
DIGITAL DIAGNOSTIC BILATERAL MAMMOGRAM WITH TOMO AND CAD

[L CC]
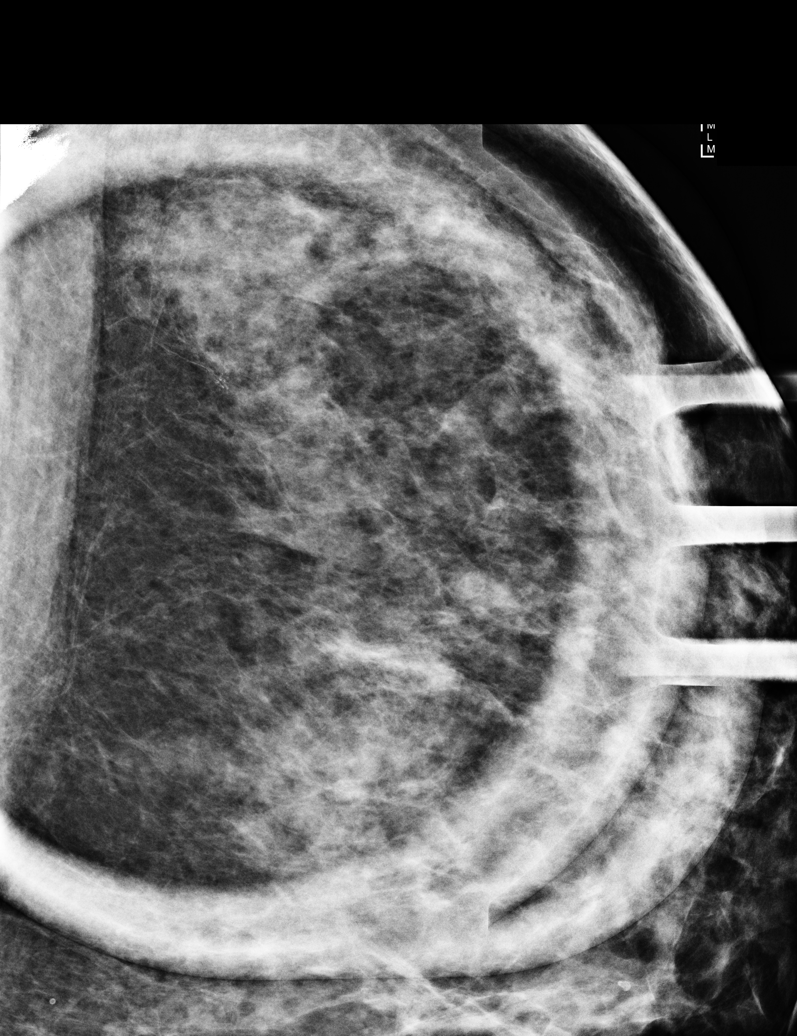

[L ML]
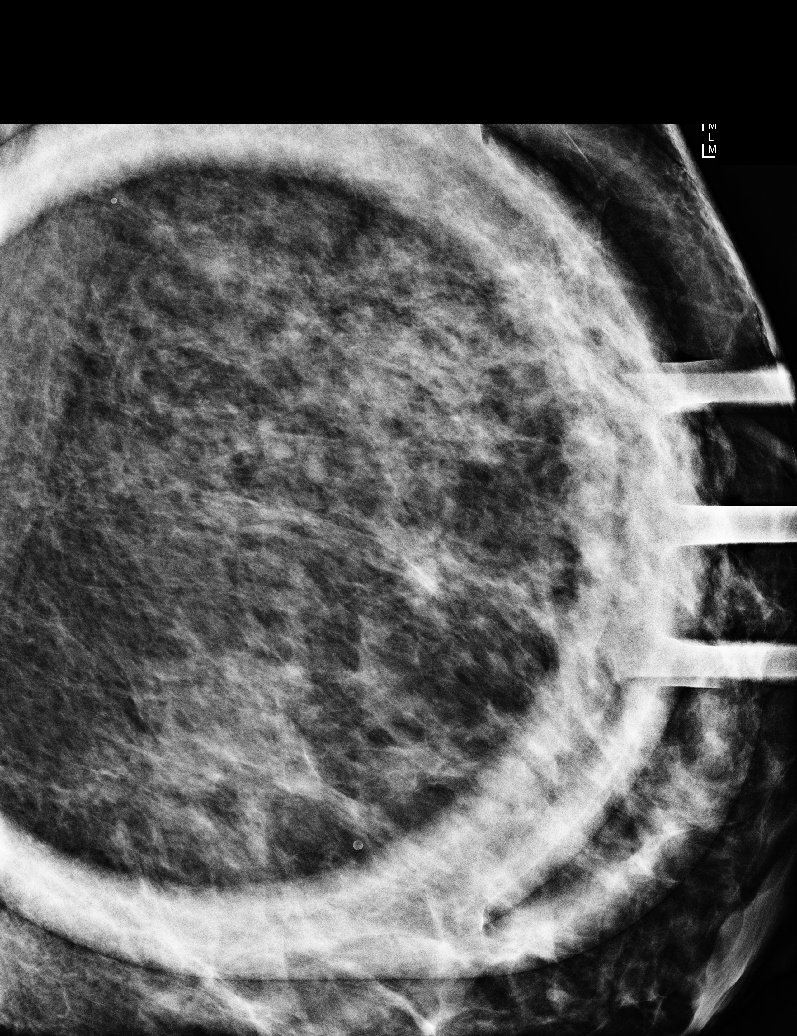

[R MLO synth-2D]
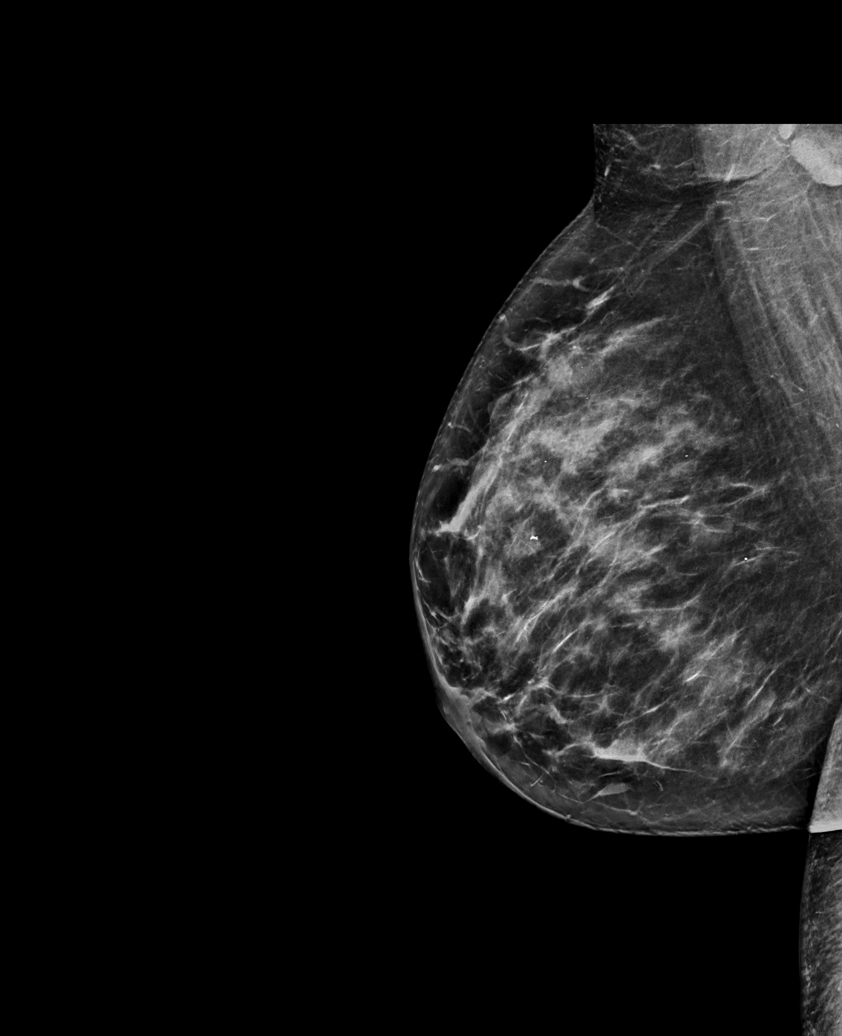

[L MLO synth-2D]
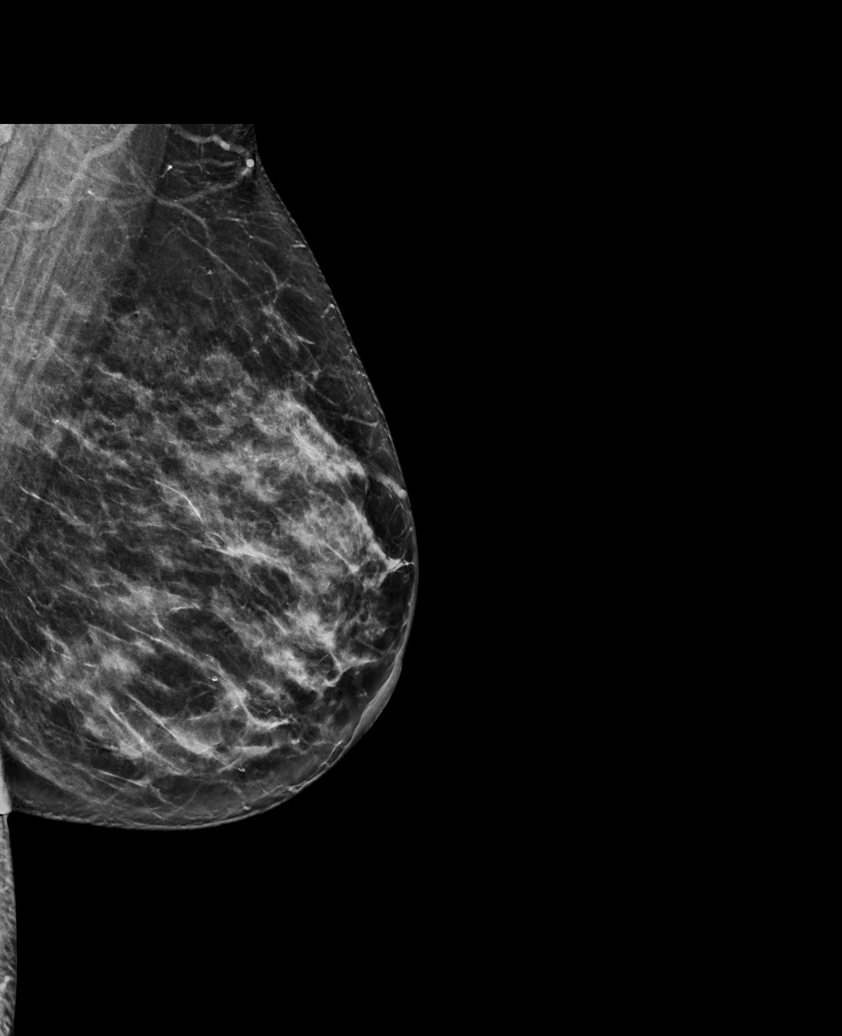

[L CC synth-2D]
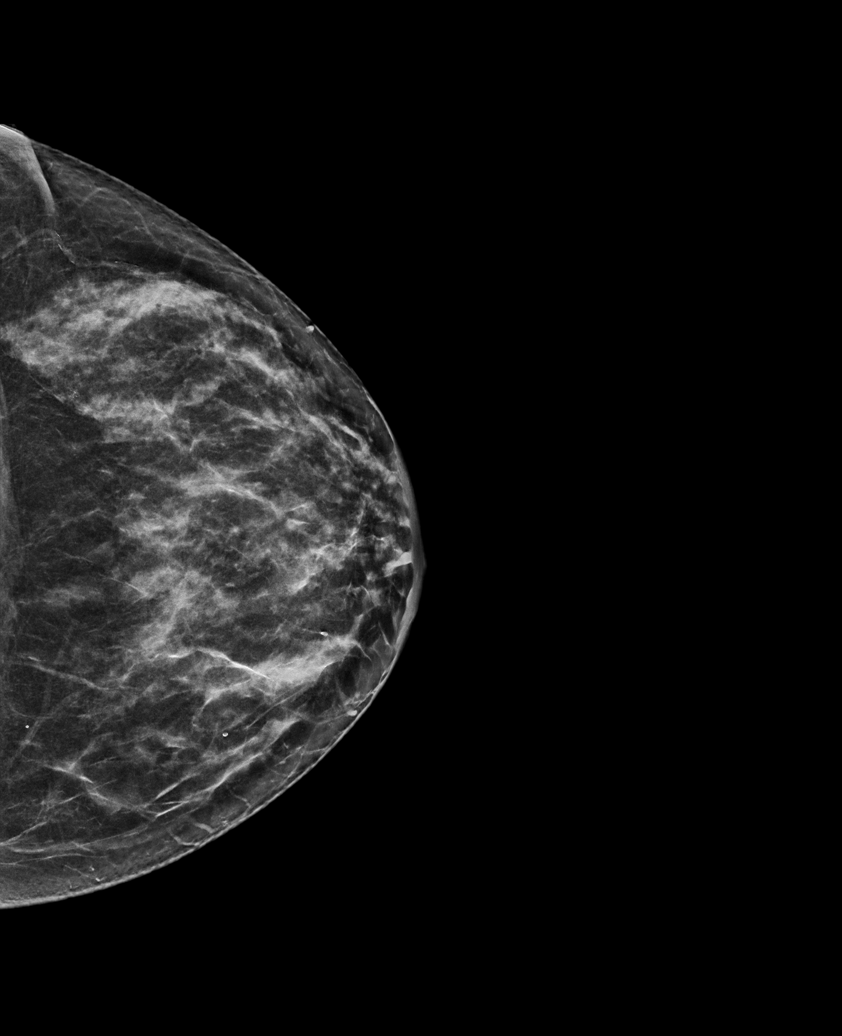

[R CC synth-2D]
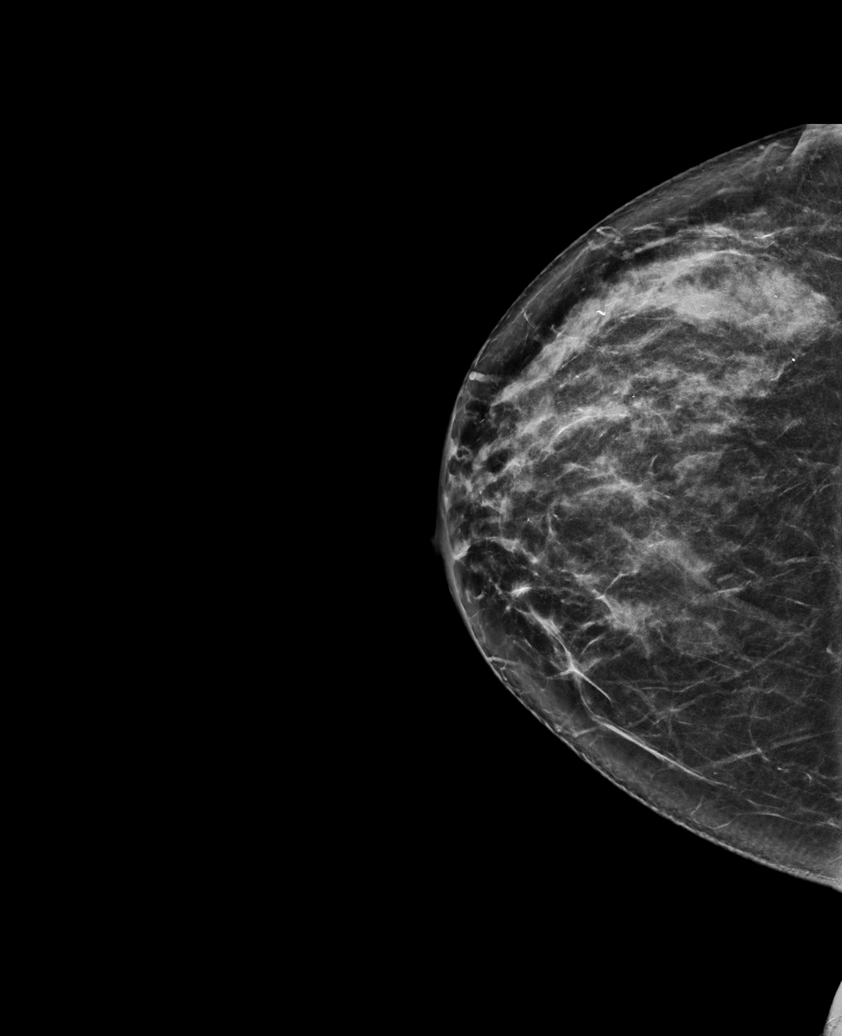

[6 of 26 positions shown; findings below may reference images not displayed]

ACR Breast Density Category c: The breast tissue is heterogeneously
dense, which may obscure small masses.
FINDINGS: Magnification cc and true lateral views of the left breast were
obtained demonstrating a grossly unchanged 5 mm group of punctate
calcifications, potentially early dystrophic calcifications within
the upper-outer left breast. No additional new findings within
either breast.

Mammographic images were processed with CAD.
IMPRESSION: Stable probably benign left breast calcifications.

RECOMMENDATION:
Bilateral diagnostic mammography with left breast magnification
views in 12 months.

I have discussed the findings and recommendations with the patient.
If applicable, a reminder letter will be sent to the patient
regarding the next appointment.

BI-RADS CATEGORY  3: Probably benign.

## 2020-09-06 ENCOUNTER — Other Ambulatory Visit (HOSPITAL_COMMUNITY): Payer: Self-pay | Admitting: Internal Medicine

## 2020-09-06 MED FILL — LISINOPRIL-HCTZ 20-25 MG TA: 20-25 | 90 days supply | Qty: 90 | Fill #0

## 2020-11-15 ENCOUNTER — Other Ambulatory Visit (HOSPITAL_COMMUNITY): Payer: Self-pay | Admitting: Internal Medicine

## 2020-11-16 MED FILL — VASCEPA 1 GM CAPSULE: 1 | 90 days supply | Qty: 360 | Fill #0

## 2020-11-20 MED FILL — LISINOPRIL-HCTZ 20-25 MG TA: 20-25 | 90 days supply | Qty: 90 | Fill #0

## 2021-03-15 ENCOUNTER — Other Ambulatory Visit (HOSPITAL_COMMUNITY): Payer: Self-pay

## 2021-03-15 MED FILL — Lisinopril & Hydrochlorothiazide Tab 20-25 MG: ORAL | 90 days supply | Qty: 90 | Fill #0 | Status: AC

## 2021-03-15 MED FILL — Icosapent Ethyl Cap 1 GM: ORAL | 90 days supply | Qty: 360 | Fill #0 | Status: AC

## 2021-03-16 ENCOUNTER — Other Ambulatory Visit (HOSPITAL_COMMUNITY): Payer: Self-pay

## 2021-04-12 DIAGNOSIS — H5203 Hypermetropia, bilateral: Secondary | ICD-10-CM | POA: Diagnosis not present

## 2021-04-12 DIAGNOSIS — H524 Presbyopia: Secondary | ICD-10-CM | POA: Diagnosis not present

## 2021-04-12 DIAGNOSIS — H52201 Unspecified astigmatism, right eye: Secondary | ICD-10-CM | POA: Diagnosis not present

## 2021-05-22 ENCOUNTER — Other Ambulatory Visit: Payer: Self-pay | Admitting: Obstetrics and Gynecology

## 2021-05-22 DIAGNOSIS — Z1231 Encounter for screening mammogram for malignant neoplasm of breast: Secondary | ICD-10-CM

## 2021-05-24 ENCOUNTER — Other Ambulatory Visit: Payer: Self-pay | Admitting: Obstetrics and Gynecology

## 2021-05-24 DIAGNOSIS — R921 Mammographic calcification found on diagnostic imaging of breast: Secondary | ICD-10-CM

## 2021-06-08 DIAGNOSIS — Z1212 Encounter for screening for malignant neoplasm of rectum: Secondary | ICD-10-CM | POA: Diagnosis not present

## 2021-06-08 DIAGNOSIS — Z0000 Encounter for general adult medical examination without abnormal findings: Secondary | ICD-10-CM | POA: Diagnosis not present

## 2021-06-08 DIAGNOSIS — I10 Essential (primary) hypertension: Secondary | ICD-10-CM | POA: Diagnosis not present

## 2021-06-08 DIAGNOSIS — E782 Mixed hyperlipidemia: Secondary | ICD-10-CM | POA: Diagnosis not present

## 2021-06-08 DIAGNOSIS — R7309 Other abnormal glucose: Secondary | ICD-10-CM | POA: Diagnosis not present

## 2021-07-03 ENCOUNTER — Other Ambulatory Visit (HOSPITAL_COMMUNITY): Payer: Self-pay

## 2021-07-03 MED ORDER — LISINOPRIL-HYDROCHLOROTHIAZIDE 20-25 MG PO TABS
1.0000 | ORAL_TABLET | Freq: Every day | ORAL | 1 refills | Status: DC
  Filled 2021-07-03: qty 90, 90d supply, fill #0
  Filled 2021-09-27: qty 90, 90d supply, fill #1

## 2021-07-04 ENCOUNTER — Other Ambulatory Visit (HOSPITAL_COMMUNITY): Payer: Self-pay

## 2021-07-04 MED ORDER — ICOSAPENT ETHYL 1 G PO CAPS
2.0000 g | ORAL_CAPSULE | Freq: Two times a day (BID) | ORAL | 1 refills | Status: DC
  Filled 2021-07-04: qty 360, 90d supply, fill #0
  Filled 2022-04-23: qty 360, 90d supply, fill #1

## 2021-07-05 ENCOUNTER — Ambulatory Visit
Admission: RE | Admit: 2021-07-05 | Discharge: 2021-07-05 | Disposition: A | Discharge status: Home / Self Care | Payer: 59 | Source: Ambulatory Visit | Attending: Obstetrics and Gynecology | Admitting: Obstetrics and Gynecology

## 2021-07-05 ENCOUNTER — Other Ambulatory Visit: Payer: Self-pay

## 2021-07-05 ENCOUNTER — Other Ambulatory Visit (HOSPITAL_COMMUNITY): Payer: Self-pay

## 2021-07-05 DIAGNOSIS — R921 Mammographic calcification found on diagnostic imaging of breast: Secondary | ICD-10-CM | POA: Diagnosis not present

## 2021-07-10 DIAGNOSIS — Z1212 Encounter for screening for malignant neoplasm of rectum: Secondary | ICD-10-CM | POA: Diagnosis not present

## 2021-07-10 DIAGNOSIS — Z6839 Body mass index (BMI) 39.0-39.9, adult: Secondary | ICD-10-CM | POA: Diagnosis not present

## 2021-07-10 DIAGNOSIS — Z1211 Encounter for screening for malignant neoplasm of colon: Secondary | ICD-10-CM | POA: Diagnosis not present

## 2021-07-10 DIAGNOSIS — Z01419 Encounter for gynecological examination (general) (routine) without abnormal findings: Secondary | ICD-10-CM | POA: Diagnosis not present

## 2021-07-10 DIAGNOSIS — Z124 Encounter for screening for malignant neoplasm of cervix: Secondary | ICD-10-CM | POA: Diagnosis not present

## 2021-07-10 DIAGNOSIS — Z1389 Encounter for screening for other disorder: Secondary | ICD-10-CM | POA: Diagnosis not present

## 2021-07-10 DIAGNOSIS — Z1151 Encounter for screening for human papillomavirus (HPV): Secondary | ICD-10-CM | POA: Diagnosis not present

## 2021-07-18 LAB — COLOGUARD: COLOGUARD: NEGATIVE

## 2021-09-27 ENCOUNTER — Other Ambulatory Visit (HOSPITAL_COMMUNITY): Payer: Self-pay

## 2022-01-04 ENCOUNTER — Other Ambulatory Visit (HOSPITAL_COMMUNITY): Payer: Self-pay

## 2022-01-04 MED ORDER — LISINOPRIL-HYDROCHLOROTHIAZIDE 20-25 MG PO TABS
1.0000 | ORAL_TABLET | Freq: Every day | ORAL | 0 refills | Status: DC
  Filled 2022-01-04: qty 90, 90d supply, fill #0

## 2022-04-23 ENCOUNTER — Other Ambulatory Visit (HOSPITAL_COMMUNITY): Payer: Self-pay

## 2022-04-23 MED ORDER — LISINOPRIL-HYDROCHLOROTHIAZIDE 20-25 MG PO TABS
1.0000 | ORAL_TABLET | Freq: Every day | ORAL | 0 refills | Status: DC
  Filled 2022-04-23: qty 90, 90d supply, fill #0

## 2022-04-26 ENCOUNTER — Other Ambulatory Visit (HOSPITAL_COMMUNITY): Payer: Self-pay

## 2022-04-26 MED ORDER — TRANEXAMIC ACID 650 MG PO TABS
1300.0000 mg | ORAL_TABLET | Freq: Three times a day (TID) | ORAL | 2 refills | Status: DC
  Filled 2022-04-26: qty 30, 5d supply, fill #0

## 2022-05-09 DIAGNOSIS — N72 Inflammatory disease of cervix uteri: Secondary | ICD-10-CM | POA: Diagnosis not present

## 2022-05-09 DIAGNOSIS — N939 Abnormal uterine and vaginal bleeding, unspecified: Secondary | ICD-10-CM | POA: Diagnosis not present

## 2022-05-09 DIAGNOSIS — N841 Polyp of cervix uteri: Secondary | ICD-10-CM | POA: Diagnosis not present

## 2022-05-14 DIAGNOSIS — I10 Essential (primary) hypertension: Secondary | ICD-10-CM | POA: Diagnosis not present

## 2022-05-14 DIAGNOSIS — E782 Mixed hyperlipidemia: Secondary | ICD-10-CM | POA: Diagnosis not present

## 2022-05-14 DIAGNOSIS — Z0000 Encounter for general adult medical examination without abnormal findings: Secondary | ICD-10-CM | POA: Diagnosis not present

## 2022-05-14 DIAGNOSIS — R7309 Other abnormal glucose: Secondary | ICD-10-CM | POA: Diagnosis not present

## 2022-06-22 ENCOUNTER — Telehealth: Payer: Self-pay | Admitting: Urgent Care

## 2022-06-22 DIAGNOSIS — J029 Acute pharyngitis, unspecified: Secondary | ICD-10-CM

## 2022-06-22 NOTE — Progress Notes (Signed)
E-Visit for Sore Throat  We are sorry that you are not feeling well.  Here is how we plan to help!  Your symptoms indicate a likely viral infection (Pharyngitis).   Pharyngitis is inflammation in the back of the throat which can cause a sore throat, scratchiness and sometimes difficulty swallowing.   Pharyngitis is typically caused by a respiratory virus and will just run its course.  Please keep in mind that your symptoms could last up to 10 days.  For throat pain, we recommend over the counter oral pain relief medications such as acetaminophen or aspirin, or anti-inflammatory medications such as ibuprofen or naproxen sodium.  Topical treatments such as oral throat lozenges or sprays may be used as needed.  Avoid close contact with loved ones, especially the very young and elderly.  Remember to wash your hands thoroughly throughout the day as this is the number one way to prevent the spread of infection and wipe down door knobs and counters with disinfectant.  After careful review of your answers, I would not recommend an antibiotic for your condition.  Antibiotics should not be used to treat conditions that we suspect are caused by viruses like the virus that causes the common cold or flu. However, some people can have Strep with atypical symptoms. You may need formal testing in clinic or office to confirm if your symptoms continue or worsen.  Providers prescribe antibiotics to treat infections caused by bacteria. Antibiotics are very powerful in treating bacterial infections when they are used properly.  To maintain their effectiveness, they should be used only when necessary.  Overuse of antibiotics has resulted in the development of super bugs that are resistant to treatment!    Home Care: Only take medications as instructed by your medical team. Do not drink alcohol while taking these medications. A steam or ultrasonic humidifier can help congestion.  You can place a towel over your head and  breathe in the steam from hot water coming from a faucet. Avoid close contacts especially the very young and the elderly. Cover your mouth when you cough or sneeze. Always remember to wash your hands.  Get Help Right Away If: You develop worsening fever or throat pain. You develop a severe head ache or visual changes. Your symptoms persist after you have completed your treatment plan.  Make sure you Understand these instructions. Will watch your condition. Will get help right away if you are not doing well or get worse.   Thank you for choosing an e-visit.  Your e-visit answers were reviewed by a board certified advanced clinical practitioner to complete your personal care plan. Depending upon the condition, your plan could have included both over the counter or prescription medications.  Please review your pharmacy choice. Make sure the pharmacy is open so you can pick up prescription now. If there is a problem, you may contact your provider through [employer] and have the prescription routed to another pharmacy.  Your safety is important to [place]. If you have drug allergies check your prescription carefully.   For the next 24 hours you can use MyChart to ask questions about today's visit, request a non-urgent call back, or ask for a work or school excuse. You will get an email in the next two days asking about your experience. I hope that your e-visit has been valuable and will speed your recovery.   I have spent 5 minutes in review of e-visit questionnaire, review and updating patient chart, medical decision making and  response to patient.   Donna Bound L Khup Sapia, PA

## 2022-06-24 ENCOUNTER — Encounter: Payer: 59 | Admitting: Physician Assistant

## 2022-06-24 ENCOUNTER — Telehealth: Payer: 59 | Admitting: Physician Assistant

## 2022-06-24 DIAGNOSIS — A6920 Lyme disease, unspecified: Secondary | ICD-10-CM | POA: Diagnosis not present

## 2022-06-24 DIAGNOSIS — Z9189 Other specified personal risk factors, not elsewhere classified: Secondary | ICD-10-CM

## 2022-06-25 ENCOUNTER — Encounter: Payer: 59 | Admitting: Physician Assistant

## 2022-06-25 MED ORDER — DOXYCYCLINE HYCLATE 100 MG PO TABS: 100.0000 mg | ORAL_TABLET | Freq: Two times a day (BID) | ORAL | 0 refills | Status: AC

## 2022-06-25 NOTE — Telephone Encounter (Signed)
This encounter was created in error - please disregard. Duplicate

## 2022-06-25 NOTE — Progress Notes (Signed)
E-Visit for Tick Bite  Thank you for describing your tick bite, Here is how we plan to help! Based on the information that you shared with me it looks like you have An infected or complicated tick bite that requires a longer course of antibiotics and will need for you to schedule a follow-up visit with a provider.  In most cases a tick bite is painless and does not itch.  Most tick bites in which the tick is quickly removed do not require prescriptions. Ticks can transmit several diseases if they are infected and remain attacked to your skin. Therefore the length that the tick was attached and any symptoms you have experienced after the bite are import to accurately develop your custom treatment plan. In most cases a single dose of doxycycline may prevent the development of a more serious condition.  Based on your information I have Provided a home care guide for tick bites and  instructions on when to call for help. and Your symptoms indicate that you need a longer course of antibiotics and a follow up visit with a provider. I have sent doxycycline 100 mg twice a day for 21 days to the pharmacy that you selected. You will need to schedule a follow up visit with your provider. If you do not have a primary care provider you may use our telehealth physicians on the web at [place]  are associated with illness?  The Wood Tick (dog tick) is the size of a watermelon seed and can sometimes transmit [facility] spotted fever and [place] tick fever.   The Deer Tick (black-legged tick) is between the size of a poppy seed (pin head) and an apple seed, and can sometimes transmit Lyme disease.  A brown to black tick with a white splotch on its back is likely a female Amblyomma americanum ([place] tick). This tick has been associated with [place] Tick Associated illness ( STARI)  Lyme disease has become the most common tick-borne illness in the [place]. The risk of Lyme  disease following a recognized deer tick bite is estimated to be 1%.  The majority of cases of Lyme disease start with a bull's eye rash at the site of the tick bite. The rash can occur days to weeks (typically 7-10 days) after a tick bite. Treatment with antibiotics is indicated if this rash appears. Flu-like symptoms may accompany the rash, including: fever, chills, headaches, muscle aches, and fatigue. Removing ticks promptly may prevent tick borne disease.  What can be used to prevent Tick Bites?  Insect repellant with at leas 20% DEET. Wearing long pants with sock and shoes. Avoiding tall grass and heavily wooded areas. Checking your skin after being outdoors. Shower with a washcloth after outdoor exposures.  HOME CARE ADVICE FOR TICK BITE  Wood Tick Removal:  Use a pair of tweezers and grasp the wood tick close to the skin (on its head). Pull the wood tick straight upward without twisting or crushing it. Maintain a steady pressure until it releases its grip.   If tweezers aren't available, use fingers, a loop of thread around the jaws, or a needle between the jaws for traction.  Note: covering the tick with petroleum jelly, nail polish or rubbing alcohol doesn't work. Neither does touching the tick with a hot or cold object. Tiny Deer Tick Removal:   Needs to be scraped off with a knife blade or credit card edge. Place tick in a sealed container (e.g. glass jar,  zip lock plastic bag), in case your doctor wants to see it. Tick's Head Removal:  If the wood tick's head breaks off in the skin, it must be removed. Clean the skin. Then use a sterile needle to uncover the head and lift it out or scrape it off.  If a very small piece of the head remains, the skin will eventually slough it off. Antibiotic Ointment:  Wash the wound and your hands with soap and water after removal to prevent catching any tick disease.  Apply an over the counter antibiotic ointment (e.g. bacitracin) to the bite  once. Expected Course: Tick bites normally don't itch or hurt. That's why they often go unnoticed. Call Your Doctor If:  You can't remove the tick or the tick's head Fever, a severe head ache, or rash occur in the next 2 weeks Bite begins to look infected Lyme's disease is common in your area You have not had a tetanus in the last 10 years Your current symptoms become worse    MAKE SURE YOU  Understand these instructions. Will watch your condition. Will get help right away if you are not doing well or get worse.    Thank you for choosing an e-visit.  Your e-visit answers were reviewed by a board certified advanced clinical practitioner to complete your personal care plan. Depending upon the condition, your plan could have included both over the counter or prescription medications.  Please review your pharmacy choice. Make sure the pharmacy is open so you can pick up prescription now. If there is a problem, you may contact your provider through [employer] and have the prescription routed to another pharmacy.  Your safety is important to [place]. If you have drug allergies check your prescription carefully.   For the next 24 hours you can use MyChart to ask questions about today's visit, request a non-urgent call back, or ask for a work or school excuse. You will get an email in the next two days asking about your experience. I hope that your e-visit has been valuable and will speed your recovery.

## 2022-06-25 NOTE — Progress Notes (Signed)
Duplicate encounter  Erroneous

## 2022-06-25 NOTE — Progress Notes (Signed)
I have spent 5 minutes in review of e-visit questionnaire, review and updating patient chart, medical decision making and response to patient.   Scottlynn Lindell Cody Vivianna Piccini, PA-C    

## 2022-06-25 NOTE — Telephone Encounter (Signed)
This encounter was created in error - please disregard.

## 2022-06-25 NOTE — Progress Notes (Signed)
Erroneous encounter -- disregard. Triplicate visit.

## 2022-06-26 ENCOUNTER — Emergency Department (HOSPITAL_COMMUNITY): Payer: 59

## 2022-06-26 ENCOUNTER — Other Ambulatory Visit: Payer: Self-pay

## 2022-06-26 ENCOUNTER — Observation Stay (HOSPITAL_COMMUNITY)
Admission: EM | Admit: 2022-06-26 | Discharge: 2022-06-27 | Disposition: A | Discharge status: Home / Self Care | Payer: 59 | Attending: Student | Admitting: Student

## 2022-06-26 ENCOUNTER — Encounter (HOSPITAL_COMMUNITY): Payer: Self-pay | Admitting: Emergency Medicine

## 2022-06-26 DIAGNOSIS — I10 Essential (primary) hypertension: Secondary | ICD-10-CM | POA: Insufficient documentation

## 2022-06-26 DIAGNOSIS — I160 Hypertensive urgency: Secondary | ICD-10-CM | POA: Insufficient documentation

## 2022-06-26 DIAGNOSIS — R778 Other specified abnormalities of plasma proteins: Secondary | ICD-10-CM | POA: Diagnosis not present

## 2022-06-26 DIAGNOSIS — Z20822 Contact with and (suspected) exposure to covid-19: Secondary | ICD-10-CM | POA: Diagnosis not present

## 2022-06-26 DIAGNOSIS — R000 Tachycardia, unspecified: Secondary | ICD-10-CM | POA: Diagnosis not present

## 2022-06-26 DIAGNOSIS — R509 Fever, unspecified: Principal | ICD-10-CM | POA: Diagnosis present

## 2022-06-26 DIAGNOSIS — Z79899 Other long term (current) drug therapy: Secondary | ICD-10-CM | POA: Insufficient documentation

## 2022-06-26 DIAGNOSIS — R9431 Abnormal electrocardiogram [ECG] [EKG]: Secondary | ICD-10-CM | POA: Diagnosis not present

## 2022-06-26 DIAGNOSIS — D72819 Decreased white blood cell count, unspecified: Secondary | ICD-10-CM | POA: Insufficient documentation

## 2022-06-26 DIAGNOSIS — Z6839 Body mass index (BMI) 39.0-39.9, adult: Secondary | ICD-10-CM | POA: Insufficient documentation

## 2022-06-26 DIAGNOSIS — R079 Chest pain, unspecified: Secondary | ICD-10-CM | POA: Diagnosis not present

## 2022-06-26 DIAGNOSIS — Z7982 Long term (current) use of aspirin: Secondary | ICD-10-CM | POA: Insufficient documentation

## 2022-06-26 DIAGNOSIS — R739 Hyperglycemia, unspecified: Secondary | ICD-10-CM

## 2022-06-26 DIAGNOSIS — R6510 Systemic inflammatory response syndrome (SIRS) of non-infectious origin without acute organ dysfunction: Secondary | ICD-10-CM | POA: Diagnosis not present

## 2022-06-26 DIAGNOSIS — E876 Hypokalemia: Secondary | ICD-10-CM | POA: Diagnosis not present

## 2022-06-26 DIAGNOSIS — R0602 Shortness of breath: Secondary | ICD-10-CM | POA: Diagnosis not present

## 2022-06-26 DIAGNOSIS — E669 Obesity, unspecified: Secondary | ICD-10-CM

## 2022-06-26 LAB — URINALYSIS, ROUTINE W REFLEX MICROSCOPIC
Bacteria, UA: NONE SEEN
Bilirubin Urine: NEGATIVE
Glucose, UA: NEGATIVE mg/dL
Ketones, ur: NEGATIVE mg/dL
Leukocytes,Ua: NEGATIVE
Nitrite: NEGATIVE
Protein, ur: NEGATIVE mg/dL
Specific Gravity, Urine: 1.021 (ref 1.005–1.030)
pH: 7 (ref 5.0–8.0)

## 2022-06-26 LAB — PROTIME-INR
INR: 1 (ref 0.8–1.2)
Prothrombin Time: 13.4 seconds (ref 11.4–15.2)

## 2022-06-26 LAB — COMPREHENSIVE METABOLIC PANEL
ALT: 41 U/L (ref 0–44)
AST: 38 U/L (ref 15–41)
Albumin: 3.6 g/dL (ref 3.5–5.0)
Alkaline Phosphatase: 81 U/L (ref 38–126)
Anion gap: 8 (ref 5–15)
BUN: 11 mg/dL (ref 6–20)
CO2: 23 mmol/L (ref 22–32)
Calcium: 8.5 mg/dL — ABNORMAL LOW (ref 8.9–10.3)
Chloride: 106 mmol/L (ref 98–111)
Creatinine, Ser: 0.87 mg/dL (ref 0.44–1.00)
GFR, Estimated: >60 mL/min (ref >60)
Glucose, Bld: 166 mg/dL — ABNORMAL HIGH (ref 70–99)
Potassium: 3.1 mmol/L — ABNORMAL LOW (ref 3.5–5.1)
Sodium: 137 mmol/L (ref 135–145)
Total Bilirubin: 0.3 mg/dL (ref 0.3–1.2)
Total Protein: 7.3 g/dL (ref 6.5–8.1)

## 2022-06-26 LAB — TROPONIN I (HIGH SENSITIVITY)
Troponin I (High Sensitivity): 28 ng/L — ABNORMAL HIGH (ref <18)
Troponin I (High Sensitivity): 39 ng/L — ABNORMAL HIGH (ref <18)

## 2022-06-26 LAB — CBC WITH DIFFERENTIAL/PLATELET
Abs Immature Granulocytes: 0.04 10*3/uL (ref 0.00–0.07)
Basophils Absolute: 0 10*3/uL (ref 0.0–0.1)
Basophils Relative: 0 %
Eosinophils Absolute: 0 10*3/uL (ref 0.0–0.5)
Eosinophils Relative: 0 %
HCT: 35.5 % — ABNORMAL LOW (ref 36.0–46.0)
Hemoglobin: 11.8 g/dL — ABNORMAL LOW (ref 12.0–15.0)
Immature Granulocytes: 1 %
Lymphocytes Relative: 27 %
Lymphs Abs: 0.9 10*3/uL (ref 0.7–4.0)
MCH: 28.2 pg (ref 26.0–34.0)
MCHC: 33.2 g/dL (ref 30.0–36.0)
MCV: 84.7 fL (ref 80.0–100.0)
Monocytes Absolute: 0.3 10*3/uL (ref 0.1–1.0)
Monocytes Relative: 9 %
Neutro Abs: 2.1 10*3/uL (ref 1.7–7.7)
Neutrophils Relative %: 63 %
Platelets: 194 10*3/uL (ref 150–400)
RBC: 4.19 MIL/uL (ref 3.87–5.11)
RDW: 13.5 % (ref 11.5–15.5)
WBC: 3.3 10*3/uL — ABNORMAL LOW (ref 4.0–10.5)
nRBC: 0 % (ref 0.0–0.2)

## 2022-06-26 LAB — APTT: aPTT: 27 seconds (ref 24–36)

## 2022-06-26 LAB — MAGNESIUM: Magnesium: 1.7 mg/dL (ref 1.7–2.4)

## 2022-06-26 LAB — HCG, SERUM, QUALITATIVE: Preg, Serum: NEGATIVE

## 2022-06-26 LAB — PHOSPHORUS: Phosphorus: 1.6 mg/dL — ABNORMAL LOW (ref 2.5–4.6)

## 2022-06-26 LAB — LACTIC ACID, PLASMA
Lactic Acid, Venous: 1.5 mmol/L (ref 0.5–1.9)
Lactic Acid, Venous: 1.2 mmol/L (ref 0.5–1.9)

## 2022-06-26 LAB — SARS CORONAVIRUS 2 BY RT PCR: SARS Coronavirus 2 by RT PCR: NEGATIVE

## 2022-06-26 MED ORDER — ASPIRIN 81 MG PO TBEC
81.0000 mg | DELAYED_RELEASE_TABLET | Freq: Every day | ORAL | Status: DC
  Administered 2022-06-27: 81 mg via ORAL
  Filled 2022-06-26: qty 1

## 2022-06-26 MED ORDER — LISINOPRIL-HYDROCHLOROTHIAZIDE 20-25 MG PO TABS
1.0000 | ORAL_TABLET | Freq: Every day | ORAL | Status: DC
Start: 2022-06-26 — End: 2022-06-26

## 2022-06-26 MED ORDER — POTASSIUM CHLORIDE CRYS ER 20 MEQ PO TBCR
40.0000 meq | EXTENDED_RELEASE_TABLET | Freq: Once | ORAL | Status: AC
  Administered 2022-06-26: 40 meq via ORAL
  Filled 2022-06-26: qty 2

## 2022-06-26 MED ORDER — DOXYCYCLINE HYCLATE 100 MG PO TABS
100.0000 mg | ORAL_TABLET | Freq: Two times a day (BID) | ORAL | Status: DC
  Administered 2022-06-26 – 2022-06-27 (×3): 100 mg via ORAL
  Filled 2022-06-26 (×3): qty 1

## 2022-06-26 MED ORDER — LACTATED RINGERS IV BOLUS (SEPSIS)
1000.0000 mL | Freq: Once | INTRAVENOUS | Status: AC
  Administered 2022-06-26: 1000 mL via INTRAVENOUS

## 2022-06-26 MED ORDER — ASPIRIN 81 MG PO CHEW
324.0000 mg | CHEWABLE_TABLET | Freq: Once | ORAL | Status: AC
  Administered 2022-06-26: 324 mg via ORAL
  Filled 2022-06-26: qty 4

## 2022-06-26 MED ORDER — ENOXAPARIN SODIUM 40 MG/0.4ML IJ SOSY
40.0000 mg | PREFILLED_SYRINGE | INTRAMUSCULAR | Status: DC
  Filled 2022-06-26: qty 0.4

## 2022-06-26 MED ORDER — LISINOPRIL 10 MG PO TABS
20.0000 mg | ORAL_TABLET | Freq: Every day | ORAL | Status: DC
  Administered 2022-06-26 – 2022-06-27 (×2): 20 mg via ORAL
  Filled 2022-06-26 (×2): qty 2

## 2022-06-26 MED ORDER — IOHEXOL 350 MG/ML SOLN
100.0000 mL | Freq: Once | INTRAVENOUS | Status: AC | PRN
  Administered 2022-06-26: 80 mL via INTRAVENOUS

## 2022-06-26 MED ORDER — POTASSIUM PHOSPHATES 15 MMOLE/5ML IV SOLN
45.0000 mmol | Freq: Once | INTRAVENOUS | Status: AC
  Administered 2022-06-26: 45 mmol via INTRAVENOUS
  Filled 2022-06-26: qty 15

## 2022-06-26 MED ORDER — HYDROCHLOROTHIAZIDE 25 MG PO TABS
25.0000 mg | ORAL_TABLET | Freq: Every day | ORAL | Status: DC
  Administered 2022-06-26: 25 mg via ORAL
  Filled 2022-06-26: qty 1

## 2022-06-26 MED ORDER — ADULT MULTIVITAMIN W/MINERALS CH
1.0000 | ORAL_TABLET | ORAL | Status: DC
  Administered 2022-06-26: 1 via ORAL
  Filled 2022-06-26: qty 1

## 2022-06-26 MED ORDER — OMEGA-3-ACID ETHYL ESTERS 1 G PO CAPS
1.0000 g | ORAL_CAPSULE | Freq: Every day | ORAL | Status: DC
  Administered 2022-06-26 – 2022-06-27 (×2): 1 g via ORAL
  Filled 2022-06-26 (×2): qty 1

## 2022-06-26 MED ORDER — ACETAMINOPHEN 325 MG PO TABS
650.0000 mg | ORAL_TABLET | Freq: Four times a day (QID) | ORAL | Status: DC | PRN
  Administered 2022-06-26 – 2022-06-27 (×2): 650 mg via ORAL
  Filled 2022-06-26 (×2): qty 2

## 2022-06-26 MED ORDER — ENOXAPARIN SODIUM 60 MG/0.6ML IJ SOSY
45.0000 mg | PREFILLED_SYRINGE | INTRAMUSCULAR | Status: DC
  Administered 2022-06-26: 45 mg via SUBCUTANEOUS
  Filled 2022-06-26: qty 0.6

## 2022-06-26 NOTE — ED Triage Notes (Signed)
Pt c/o sob and chest pain that started tonight. Pt started taking doxycycline yesterday.

## 2022-06-26 NOTE — H&P (Addendum)
History and Physical    Patient: Donna Washington DSK:[id number] DOB: 1966/05/30 DOA: 06/26/2022 DOS: the patient was seen and examined on 06/26/2022 PCP: Scotty Court, DO  Patient coming from: Home  Chief Complaint:  Chief Complaint  Patient presents with   Shortness of Breath   HPI: Donna Washington is a 56 y.o. female with medical history significant of hypertension who presents to the emergency department due to fever, chest pain and difficulty in being able to take a deep which started yesterday.  Patient complained of itchiness of her scalp on Saturday (7/30) and complained of increased gland inflammation around the jaw and joint pain.  She had a virtual medical visit on Sunday (7/31) and she was prescribed with doxycycline which she has been taking.  However, she complained of fever of 101F at home which improved with 2 tablets of extra strength Tylenol, she states that she has been intermittently taking Tylenol and ibuprofen.  She denies chest pain, nausea, vomiting, rashes other than irritation at the origin at the bite.  She was unsure how long the tick was on head prior to noticing this.  ED Course:  In the emergency department, he was tachypneic and tachycardic, BP was 203/96, temperature on arrival was 99.7F and O2 sat was 96% on room air.  Work-up in the ED shows leukopenia and normocytic anemia, BMP was normal except for hypokalemia and hyperglycemia, troponin x1 was 28, lactic acid 1.5, blood culture showed no growth in less than 12 hours. Chest x-ray showed no active disease CT angiography of chest with contrast showed no evidence for acute pulmonary embolism IV hydration was provided, aspirin 324 mg x 1 was given.  Hospitalist was asked to admit patient for further evaluation and management  Review of Systems: Review of systems as noted in the HPI. All other systems reviewed and are negative.   Past Medical History:  Diagnosis Date   Hypertension     History reviewed. No pertinent surgical history.  Social History:  reports that she has never smoked. She does not have any smokeless tobacco history on file. She reports that she does not currently use alcohol. No history on file for drug use.   No Known Allergies  Family History  Problem Relation Age of Onset   Breast cancer Mother 37     Prior to Admission medications   Medication Sig Start Date End Date Taking? Authorizing Provider  aspirin 81 MG tablet Take 81 mg by mouth daily.    [provider]  doxycycline (VIBRA-TABS) 100 MG tablet Take 1 tablet (100 mg total) by mouth 2 (two) times daily for 21 days. 06/25/22 07/16/22  Brunetta Jeans, PA-C  icosapent Ethyl (VASCEPA) 1 g capsule Take 2 capsules (2 g total) by mouth 2 (two) times daily with meal 07/04/21     lisinopril-hydrochlorothiazide (PRINZIDE,ZESTORETIC) 20-25 MG per tablet Take 1 tablet by mouth daily.    [provider]  lisinopril-hydrochlorothiazide (ZESTORETIC) 20-25 MG tablet TAKE 1 TABLET BY MOUTH ONCE DAILY. 11/15/20 11/15/21  Adaline Sill, NP  lisinopril-hydrochlorothiazide (ZESTORETIC) 20-25 MG tablet TAKE 1 TABLET BY MOUTH ONCE DAILY 09/06/20 09/06/21  Adaline Sill, NP  lisinopril-hydrochlorothiazide (ZESTORETIC) 20-25 MG tablet Take 1 tablet by mouth daily. 04/23/22     Multiple Vitamin (MULTIVITAMIN WITH MINERALS) TABS Take 1 tablet by mouth daily.    [provider]  Omega-3 Fatty Acids (FISH OIL PO) Take 1 capsule by mouth daily.    [provider]  oxyCODONE-acetaminophen (PERCOCET) 5-325 MG per tablet Take 2 tablets by mouth every 4 (four) hours as needed for pain. 09/19/12   Barbara Cower, MD  tranexamic acid (LYSTEDA) 650 MG TABS tablet Take 2 tablets (1,300 mg total) by mouth 3 (three) times daily for 5 days 04/25/22       Physical Exam: BP (!) 151/94 (BP Location: Left Arm)   Pulse 96   Temp 99.3 F (37.4 C)   Resp 17   Ht 5' (1.524 m)   Wt 90.7  kg   SpO2 100%   BMI 39.06 kg/m   General: 56 y.o. year-old female well developed well nourished in no acute distress.  Alert and oriented x3. HEENT: Small erythema noted at patient's indicated tick bite site on scalp,  EOMI Neck: Supple, trachea medial Cardiovascular: Tachycardia.  Regular rate and rhythm with no rubs or gallops.  No thyromegaly or JVD noted.  No lower extremity edema. 2/4 pulses in all 4 extremities. Respiratory: Tachypnea.  Clear to auscultation with no wheezes or rales. Good inspiratory effort. Abdomen: Soft, nontender nondistended with normal bowel sounds x4 quadrants. Muskuloskeletal: No cyanosis, clubbing or edema noted bilaterally Neuro: CN II-XII intact, strength 5/5 x 4, sensation, reflexes intact Skin: No ulcerative lesions noted or rashes Psychiatry: Judgement and insight appear normal. Mood is appropriate for condition and setting          Labs on Admission:  Basic Metabolic Panel: Recent Labs  Lab 06/26/22 0334  NA 137  K 3.1*  CL 106  CO2 23  GLUCOSE 166*  BUN 11  CREATININE 0.87  CALCIUM 8.5*   Liver Function Tests: Recent Labs  Lab 06/26/22 0334  AST 38  ALT 41  ALKPHOS 81  BILITOT 0.3  PROT 7.3  ALBUMIN 3.6   No results for input(s): "LIPASE", "AMYLASE" in the last 168 hours. No results for input(s): "AMMONIA" in the last 168 hours. CBC: Recent Labs  Lab 06/26/22 0334  WBC 3.3*  NEUTROABS 2.1  HGB 11.8*  HCT 35.5*  MCV 84.7  PLT 194   Cardiac Enzymes: No results for input(s): "CKTOTAL", "CKMB", "CKMBINDEX", "TROPONINI" in the last 168 hours.  BNP (last 3 results) No results for input(s): "BNP" in the last 8760 hours.  ProBNP (last 3 results) No results for input(s): "PROBNP" in the last 8760 hours.  CBG: No results for input(s): "GLUCAP" in the last 168 hours.  Radiological Exams on Admission: CT Angio Chest Pulmonary Embolism (PE) W or WO Contrast  Result Date: 06/26/2022 CLINICAL DATA:  Shortness of breath.   Rule out pulmonary embolus. EXAM: CT ANGIOGRAPHY CHEST WITH CONTRAST TECHNIQUE: Multidetector CT imaging of the chest was performed using the standard protocol during bolus administration of intravenous contrast. Multiplanar CT image reconstructions and MIPs were obtained to evaluate the vascular anatomy. RADIATION DOSE REDUCTION: This exam was performed according to the departmental dose-optimization program which includes automated exposure control, adjustment of the mA and/or kV according to patient size and/or use of iterative reconstruction technique. CONTRAST:  75m OMNIPAQUE IOHEXOL 350 MG/ML SOLN COMPARISON:  None Available. FINDINGS: Cardiovascular: Satisfactory opacification of the pulmonary arteries to the segmental level. No evidence of pulmonary embolism. Normal heart size. No pericardial effusion. Mediastinum/Nodes: No enlarged mediastinal, hilar, or axillary lymph nodes. Thyroid gland, trachea, and esophagus demonstrate no significant findings. Lungs/Pleura: There is no pleural effusion, airspace consolidation, atelectasis, or pneumothorax. No suspicious lung nodules. Upper Abdomen: No acute abnormality. Musculoskeletal: No acute or suspicious osseous findings. Review of the MIP images  confirms the above findings. IMPRESSION: No evidence for acute pulmonary embolism. Electronically Signed   By: Kerby Moors M.D.   On: 06/26/2022 05:22   DG Chest Port 1 View  Result Date: 06/26/2022 CLINICAL DATA:  Chest pain and shortness of breath EXAM: PORTABLE CHEST 1 VIEW COMPARISON:  09/19/2012 FINDINGS: The heart size and mediastinal contours are within normal limits. Both lungs are clear. The visualized skeletal structures are unremarkable. IMPRESSION: No active disease. Electronically Signed   By: Inez Catalina M.D.   On: 06/26/2022 03:37    EKG: I independently viewed the EKG done and my findings are as followed: Sinus tachycardia at rate of 109 bpm with QTc of 559m  Assessment/Plan Present on  Admission:  Acute febrile illness  Principal Problem:   Acute febrile illness Active Problems:   SIRS (systemic inflammatory response syndrome) (HCC)   Hypokalemia   Leukopenia   Hypertensive urgency   Essential hypertension   Obesity (BMI 30-39.9)   Hyperglycemia   Elevated troponin   Prolonged QT interval  Acute febrile illness possibly related to tick bite Continue Tylenol as needed Continue Doxycycline Lyme disease serology and [facility] fever antibodies pending Consider infectious disease consult for worsening symptoms  SIRS in setting of above Patient presents with tachypnea, tachycardia and leukopenia (met SIRS criteria).  Lactic acid was normal  Hypertensive urgency-resolved Essential hypertension Continue Zestoretic  Hypokalemia K+ 3.1; this will be replenished  Leukopenia possibly reactive versus infectious WBC 3.3, continue treatment as described for acute febrile illness Continue to monitor WBC with normal  Elevated troponin possibly secondary to type II demand ischemia Troponin x1 - 28, patient denies chest pain, continue to trend troponin  Hyperglycemia possibly due to reactive process CBG of 166, patient has no history of type 2 diabetes Continue to monitor blood glucose level and consider checking hemoglobin A1c if CBG continues to stay elevated  Prolonged QTc  507 ms  Avoid QT prolonging drugs Magnesium level will be checked Continue telemetry  Obesity (BMI 39.06 kg/m) Continue diet and lifestyle modification  DVT prophylaxis: Lovenox  Code Status: Full code  Consults: None  Family Communication: None at bedside  Severity of Illness: The appropriate patient status for this patient is OBSERVATION. Observation status is judged to be reasonable and necessary in order to provide the required intensity of service to ensure the patient's safety. The patient's presenting symptoms, physical exam findings, and initial radiographic and  laboratory data in the context of their medical condition is felt to place them at decreased risk for further clinical deterioration. Furthermore, it is anticipated that the patient will be medically stable for discharge from the hospital within 2 midnights of admission.   Author: OBernadette Hoit DO 06/26/2022 7:28 AM  For on call review [email or web address]

## 2022-06-26 NOTE — ED Provider Notes (Signed)
[facility] EMERGENCY DEPARTMENT Provider Note   CSN: [id number] Arrival date & time: 06/26/22  0245     History  Chief Complaint  Patient presents with   Shortness of Breath    Donna Washington is a 56 y.o. female.  Patient presents to the Emergency Department for evaluation of fever, chills, chest pain and shortness of breath.  Patient reports that she did have a recent tick bite.  She had a virtual visit 2 days ago and was started on doxycycline.  No rashes noted other than irritation at the original tick bite.  Symptoms worsened tonight.       Home Medications Prior to Admission medications   Medication Sig Start Date End Date Taking? Authorizing Provider  aspirin 81 MG tablet Take 81 mg by mouth daily.    [provider]  doxycycline (VIBRA-TABS) 100 MG tablet Take 1 tablet (100 mg total) by mouth 2 (two) times daily for 21 days. 06/25/22 07/16/22  Waldon Merl, PA-C  icosapent Ethyl (VASCEPA) 1 g capsule Take 2 capsules (2 g total) by mouth 2 (two) times daily with meal 07/04/21     lisinopril-hydrochlorothiazide (PRINZIDE,ZESTORETIC) 20-25 MG per tablet Take 1 tablet by mouth daily.    [provider]  lisinopril-hydrochlorothiazide (ZESTORETIC) 20-25 MG tablet TAKE 1 TABLET BY MOUTH ONCE DAILY. 11/15/20 11/15/21  Rebekah Chesterfield, NP  lisinopril-hydrochlorothiazide (ZESTORETIC) 20-25 MG tablet TAKE 1 TABLET BY MOUTH ONCE DAILY 09/06/20 09/06/21  Rebekah Chesterfield, NP  lisinopril-hydrochlorothiazide (ZESTORETIC) 20-25 MG tablet Take 1 tablet by mouth daily. 04/23/22     Multiple Vitamin (MULTIVITAMIN WITH MINERALS) TABS Take 1 tablet by mouth daily.    [provider]  Omega-3 Fatty Acids (FISH OIL PO) Take 1 capsule by mouth daily.    [provider]  oxyCODONE-acetaminophen (PERCOCET) 5-325 MG per tablet Take 2 tablets by mouth every 4 (four) hours as needed for pain. 09/19/12   Cheri Guppy, MD  tranexamic acid (LYSTEDA)  650 MG TABS tablet Take 2 tablets (1,300 mg total) by mouth 3 (three) times daily for 5 days 04/25/22         Allergies    Patient has no known allergies.    Review of Systems   Review of Systems  Physical Exam Updated Vital Signs BP (!) 151/81   Pulse (!) 106   Temp 99.8 F (37.7 C)   Resp (!) 29   Ht 5' (1.524 m)   Wt 90.7 kg   SpO2 99%   BMI 39.06 kg/m  Physical Exam Vitals and nursing note reviewed.  Constitutional:      General: She is not in acute distress.    Appearance: She is well-developed.  HENT:     Head: Normocephalic and atraumatic.     Mouth/Throat:     Mouth: Mucous membranes are moist.  Eyes:     General: Vision grossly intact. Gaze aligned appropriately.     Extraocular Movements: Extraocular movements intact.     Conjunctiva/sclera: Conjunctivae normal.  Cardiovascular:     Rate and Rhythm: Regular rhythm. Tachycardia present.     Pulses: Normal pulses.     Heart sounds: Normal heart sounds, S1 normal and S2 normal. No murmur heard.    No friction rub. No gallop.  Pulmonary:     Effort: Pulmonary effort is normal. No respiratory distress.     Breath sounds: Normal breath sounds.  Abdominal:     General: Bowel sounds are normal.  Palpations: Abdomen is soft.     Tenderness: There is no abdominal tenderness. There is no guarding or rebound.     Hernia: No hernia is present.  Musculoskeletal:        General: No swelling.     Cervical back: Full passive range of motion without pain, normal range of motion and neck supple. No spinous process tenderness or muscular tenderness. Normal range of motion.     Right lower leg: No edema.     Left lower leg: No edema.  Skin:    General: Skin is warm and dry.     Capillary Refill: Capillary refill takes less than 2 seconds.     Findings: No ecchymosis, erythema, rash or wound.     Comments: Mild erythema with a central scabbed area vertex of scalp  Neurological:     General: No focal deficit present.      Mental Status: She is alert and oriented to person, place, and time.     GCS: GCS eye subscore is 4. GCS verbal subscore is 5. GCS motor subscore is 6.     Cranial Nerves: Cranial nerves 2-12 are intact.     Sensory: Sensation is intact.     Motor: Motor function is intact.     Coordination: Coordination is intact.  Psychiatric:        Attention and Perception: Attention normal.        Mood and Affect: Mood normal.        Speech: Speech normal.        Behavior: Behavior normal.     ED Results / Procedures / Treatments   Labs (all labs ordered are listed, but only abnormal results are displayed) Labs Reviewed  COMPREHENSIVE METABOLIC PANEL - Abnormal; Notable for the following components:      Result Value   Potassium 3.1 (*)    Glucose, Bld 166 (*)    Calcium 8.5 (*)    All other components within normal limits  CBC WITH DIFFERENTIAL/PLATELET - Abnormal; Notable for the following components:   WBC 3.3 (*)    Hemoglobin 11.8 (*)    HCT 35.5 (*)    All other components within normal limits  TROPONIN I (HIGH SENSITIVITY) - Abnormal; Notable for the following components:   Troponin I (High Sensitivity) 28 (*)    All other components within normal limits  SARS CORONAVIRUS 2 BY RT PCR  CULTURE, BLOOD (ROUTINE X 2)  CULTURE, BLOOD (ROUTINE X 2)  URINE CULTURE  LACTIC ACID, PLASMA  PROTIME-INR  APTT  HCG, SERUM, QUALITATIVE  LACTIC ACID, PLASMA  URINALYSIS, ROUTINE W REFLEX MICROSCOPIC  ROCKY MTN SPOTTED FVR ABS PNL(IGG+IGM)  LYME DISEASE SEROLOGY W/REFLEX  POC URINE PREG, ED  TROPONIN I (HIGH SENSITIVITY)    EKG EKG Interpretation  Date/Time:  Wednesday June 26 2022 03:36:36 EDT Ventricular Rate:  109 PR Interval:  158 QRS Duration: 101 QT Interval:  376 QTC Calculation: 507 R Axis:   49 Text Interpretation: Sinus tachycardia Low voltage, precordial leads Probable anteroseptal infarct, old Minimal ST depression, lateral leads Baseline wander in lead(s) V2 V3 V4  V5 V6 Confirmed by Gilda Crease [phone]) on 06/26/2022 4:13:07 AM  Radiology CT Angio Chest Pulmonary Embolism (PE) W or WO Contrast  Result Date: 06/26/2022 CLINICAL DATA:  Shortness of breath.  Rule out pulmonary embolus. EXAM: CT ANGIOGRAPHY CHEST WITH CONTRAST TECHNIQUE: Multidetector CT imaging of the chest was performed using the standard protocol during bolus administration of intravenous contrast.  Multiplanar CT image reconstructions and MIPs were obtained to evaluate the vascular anatomy. RADIATION DOSE REDUCTION: This exam was performed according to the departmental dose-optimization program which includes automated exposure control, adjustment of the mA and/or kV according to patient size and/or use of iterative reconstruction technique. CONTRAST:  105mL OMNIPAQUE IOHEXOL 350 MG/ML SOLN COMPARISON:  None Available. FINDINGS: Cardiovascular: Satisfactory opacification of the pulmonary arteries to the segmental level. No evidence of pulmonary embolism. Normal heart size. No pericardial effusion. Mediastinum/Nodes: No enlarged mediastinal, hilar, or axillary lymph nodes. Thyroid gland, trachea, and esophagus demonstrate no significant findings. Lungs/Pleura: There is no pleural effusion, airspace consolidation, atelectasis, or pneumothorax. No suspicious lung nodules. Upper Abdomen: No acute abnormality. Musculoskeletal: No acute or suspicious osseous findings. Review of the MIP images confirms the above findings. IMPRESSION: No evidence for acute pulmonary embolism. Electronically Signed   By: Signa Kell M.D.   On: 06/26/2022 05:22   DG Chest Port 1 View  Result Date: 06/26/2022 CLINICAL DATA:  Chest pain and shortness of breath EXAM: PORTABLE CHEST 1 VIEW COMPARISON:  09/19/2012 FINDINGS: The heart size and mediastinal contours are within normal limits. Both lungs are clear. The visualized skeletal structures are unremarkable. IMPRESSION: No active disease. Electronically Signed   By: Alcide Clever M.D.   On: 06/26/2022 03:37    Procedures Procedures    Medications Ordered in ED Medications  aspirin chewable tablet 324 mg (has no administration in time range)  lactated ringers bolus 1,000 mL (1,000 mLs Intravenous Bolus from Bag 06/26/22 0334)  iohexol (OMNIPAQUE) 350 MG/ML injection 100 mL (80 mLs Intravenous Contrast Given 06/26/22 0507)    ED Course/ Medical Decision Making/ A&P                           Medical Decision Making Amount and/or Complexity of Data Reviewed Labs: ordered. Radiology: ordered. ECG/medicine tests: ordered.  Risk OTC drugs. Prescription drug management.   Patient presents to the Emergency Department for evaluation of fever, chills, shortness of breath.  Patient reports discomfort in her chest but its not actually a pain, she has a sensation of not being able to get her breath in and out.  Patient had recent tick bite on her head, was started on doxycycline yesterday for the symptoms.  Symptoms have worsened overnight.  Differential diagnosis includes rickettsial illness, acute coronary syndrome, congestive heart failure, hypertensive urgency, myocarditis, pericarditis.  Patient hypertensive at arrival.  This has improved without intervention.  EKG with nonspecific ST changes.  No ST elevation MI.  White blood cell count is normal.  Chest x-ray does not show evidence of pneumonia.  Patient's troponin is mildly elevated at 28.  Based on this, CT angiography performed to evaluate for large PE with heart strain.  CT angiography does not show acute abnormality.  With the patient's infectious symptoms, myocarditis is still considered a possibility.  Additionally, cannot rule out Lyme disease or additional tick related infectious etiology of her mildly elevated troponins.  We will need to cycle enzymes to further evaluate for possible NSTEMI.  Patient will likely require echo.  We will recommend hospitalization for further management.  CRITICAL  CARE Performed by: Gilda Crease   Total critical care time: 33 minutes  Critical care time was exclusive of separately billable procedures and treating other patients.  Critical care was necessary to treat or prevent imminent or life-threatening deterioration.  Critical care was time spent personally by me on the  following activities: development of treatment plan with patient and/or surrogate as well as nursing, discussions with consultants, evaluation of patient's response to treatment, examination of patient, obtaining history from patient or surrogate, ordering and performing treatments and interventions, ordering and review of laboratory studies, ordering and review of radiographic studies, pulse oximetry and re-evaluation of patient's condition.         Final Clinical Impression(s) / ED Diagnoses Final diagnoses:  Febrile illness, acute  Elevated troponin    Rx / DC Orders ED Discharge Orders     None         Merary Garguilo, Canary Brim, MD 06/26/22 [id number]

## 2022-06-26 NOTE — Progress Notes (Signed)
Patient admitted to the hospital earlier this morning by Dr. Thomes Dinning  She says she is feeling better than she did on admission.  She no longer feels short of breath.  Does not have any documented fever since admission.  Overall lymph node tenderness in her neck feels better.  She does point out a tick bite on her scalp that she first noticed 4 to 5 days ago.  Please see picture below.    She was admitted to the hospital with a febrile illness.  She also had some shortness of breath.  Pulmonary imaging was unrevealing for any underlying pneumonia.  She reports having a tick bite approximately 4 to 5 days ago.  She was started on doxycycline by her primary care physician but since her symptoms persisted, she came to the ER for evaluation.  Titers for Lyme and RMSF have been sent.  HIV has been ordered.  Discussed with infectious disease, Dr. Earlene Plater with recommendations to continue total of 10 days of doxycycline.  Blood cultures have also been ordered and are in process.  He does not have any other obvious source of infection at this time.  She was noted to have a mildly elevated troponin.  He does not have any chest pain at this time.  Says she was having some shortness of breath on admission, echocardiogram will be checked.  If she continues to improve clinically, remains afebrile and echocardiogram is unremarkable, anticipate possible discharge in the next 24 hours

## 2022-06-26 NOTE — Progress Notes (Signed)
  Transition of Care [facility]) Screening Note   Patient Details  Name: Donna Washington Date of Birth: 09-11-1966   [facility]) CM/SW Contact:    Villa Herb, LCSWA Phone Number: 06/26/2022, 10:19 AM    Transition of Care Department [facility]) has reviewed patient and no TOC needs have been identified at this time. We will continue to monitor patient advancement through interdisciplinary progression rounds. If new patient transition needs arise, please place a TOC consult.

## 2022-06-27 ENCOUNTER — Observation Stay (HOSPITAL_BASED_OUTPATIENT_CLINIC_OR_DEPARTMENT_OTHER): Payer: 59

## 2022-06-27 DIAGNOSIS — E669 Obesity, unspecified: Secondary | ICD-10-CM | POA: Diagnosis not present

## 2022-06-27 DIAGNOSIS — R0602 Shortness of breath: Secondary | ICD-10-CM | POA: Diagnosis not present

## 2022-06-27 DIAGNOSIS — E876 Hypokalemia: Secondary | ICD-10-CM | POA: Diagnosis not present

## 2022-06-27 DIAGNOSIS — Z20822 Contact with and (suspected) exposure to covid-19: Secondary | ICD-10-CM | POA: Diagnosis not present

## 2022-06-27 DIAGNOSIS — Z6839 Body mass index (BMI) 39.0-39.9, adult: Secondary | ICD-10-CM | POA: Diagnosis not present

## 2022-06-27 DIAGNOSIS — I10 Essential (primary) hypertension: Secondary | ICD-10-CM | POA: Diagnosis not present

## 2022-06-27 DIAGNOSIS — R778 Other specified abnormalities of plasma proteins: Secondary | ICD-10-CM

## 2022-06-27 DIAGNOSIS — I160 Hypertensive urgency: Secondary | ICD-10-CM | POA: Diagnosis not present

## 2022-06-27 DIAGNOSIS — R509 Fever, unspecified: Secondary | ICD-10-CM | POA: Diagnosis not present

## 2022-06-27 DIAGNOSIS — D72819 Decreased white blood cell count, unspecified: Secondary | ICD-10-CM | POA: Diagnosis not present

## 2022-06-27 DIAGNOSIS — R6510 Systemic inflammatory response syndrome (SIRS) of non-infectious origin without acute organ dysfunction: Secondary | ICD-10-CM | POA: Diagnosis not present

## 2022-06-27 LAB — CBC
HCT: 31.7 % — ABNORMAL LOW (ref 36.0–46.0)
Hemoglobin: 10.6 g/dL — ABNORMAL LOW (ref 12.0–15.0)
MCH: 28.5 pg (ref 26.0–34.0)
MCHC: 33.4 g/dL (ref 30.0–36.0)
MCV: 85.2 fL (ref 80.0–100.0)
Platelets: 193 10*3/uL (ref 150–400)
RBC: 3.72 MIL/uL — ABNORMAL LOW (ref 3.87–5.11)
RDW: 13.7 % (ref 11.5–15.5)
WBC: 4.3 10*3/uL (ref 4.0–10.5)
nRBC: 0 % (ref 0.0–0.2)

## 2022-06-27 LAB — ECHOCARDIOGRAM COMPLETE
AR max vel: 2.24 cm2
AV Area VTI: 2.06 cm2
AV Area mean vel: 2.17 cm2
AV Mean grad: 5 mmHg
AV Peak grad: 9.7 mmHg
Ao pk vel: 1.56 m/s
Area-P 1/2: 3.65 cm2
Calc EF: 54.3 %
Height: 60 in
MV VTI: 2.48 cm2
S' Lateral: 3.7 cm
Single Plane A2C EF: 58.5 %
Single Plane A4C EF: 51.4 %
Weight: 3200 oz

## 2022-06-27 LAB — COMPREHENSIVE METABOLIC PANEL
ALT: 43 U/L (ref 0–44)
AST: 32 U/L (ref 15–41)
Albumin: 3.2 g/dL — ABNORMAL LOW (ref 3.5–5.0)
Alkaline Phosphatase: 69 U/L (ref 38–126)
Anion gap: 6 (ref 5–15)
BUN: 10 mg/dL (ref 6–20)
CO2: 25 mmol/L (ref 22–32)
Calcium: 8.5 mg/dL — ABNORMAL LOW (ref 8.9–10.3)
Chloride: 107 mmol/L (ref 98–111)
Creatinine, Ser: 0.84 mg/dL (ref 0.44–1.00)
GFR, Estimated: >60 mL/min (ref >60)
Glucose, Bld: 125 mg/dL — ABNORMAL HIGH (ref 70–99)
Potassium: 4.1 mmol/L (ref 3.5–5.1)
Sodium: 138 mmol/L (ref 135–145)
Total Bilirubin: 0.5 mg/dL (ref 0.3–1.2)
Total Protein: 6.7 g/dL (ref 6.5–8.1)

## 2022-06-27 LAB — LYME DISEASE SEROLOGY W/REFLEX: Lyme Total Antibody EIA: NEGATIVE

## 2022-06-27 LAB — ROCKY MTN SPOTTED FVR ABS PNL(IGG+IGM)
RMSF IgG: NEGATIVE
RMSF IgM: 0.86 index (ref 0.00–0.89)

## 2022-06-27 LAB — HIV ANTIBODY (ROUTINE TESTING W REFLEX): HIV Screen 4th Generation wRfx: NONREACTIVE

## 2022-06-27 LAB — PHOSPHORUS: Phosphorus: 5.9 mg/dL — ABNORMAL HIGH (ref 2.5–4.6)

## 2022-06-27 NOTE — Progress Notes (Signed)
*  PRELIMINARY RESULTS* Echocardiogram 2D Echocardiogram has been performed.  Carolyne Fiscal 06/27/2022, 11:16 AM

## 2022-06-28 ENCOUNTER — Other Ambulatory Visit: Payer: Self-pay | Admitting: Student

## 2022-06-28 ENCOUNTER — Other Ambulatory Visit (HOSPITAL_COMMUNITY): Payer: Self-pay

## 2022-06-28 LAB — URINE CULTURE: Culture: 8000 — AB

## 2022-06-28 MED ORDER — NITROFURANTOIN MONOHYD MACRO 100 MG PO CAPS
100.0000 mg | ORAL_CAPSULE | Freq: Two times a day (BID) | ORAL | 0 refills | Status: AC
  Filled 2022-06-28: qty 6, 3d supply, fill #0

## 2022-06-28 NOTE — Progress Notes (Deleted)
{  Select_TRH_Note:[id number]} 

## 2022-06-28 NOTE — Discharge Summary (Signed)
[facility] Discharge Summary   Patient: Donna Washington CHY:[id number]  PCP: Scotty Court, DO  Date of admission: 06/26/2022   Date of discharge: 06/27/2022      Discharge Diagnoses:   Principal Problem:   Acute febrile illness Active Problems:   SIRS (systemic inflammatory response syndrome) (HCC)   Hypokalemia   Leukopenia   Hypertensive urgency   Essential hypertension   Obesity (BMI 30-39.9)   Hyperglycemia   Elevated troponin   Prolonged QT interval   Admitted From: Home Disposition:  Home   Recommendations for Outpatient Follow-up:  PCP: in 1 wk Follow up LABS/TEST:    Diet recommendation: Cardiac diet  Activity: The patient is advised to gradually reintroduce usual activities, as tolerated  Discharge Condition: stable  Code Status: Full code   History of present illness: As per the H and P dictated on admission   Hospital Course:  Donna Washington is a 56 y.o. female with medical history significant of hypertension who presents to the emergency department due to fever, chest pain and difficulty in being able to take a deep which started yesterday.  Patient complained of itchiness of her scalp on Saturday (7/30) and complained of increased gland inflammation around the jaw and joint pain.  She had a virtual medical visit on Sunday (7/31) and she was prescribed with doxycycline which she has been taking.  However, she complained of fever of 101F at home which improved with 2 tablets of extra strength Tylenol, she states that she has been intermittently taking Tylenol and ibuprofen.  She denies chest pain, nausea, vomiting, rashes other than irritation at the origin at the bite.  She was unsure how long the tick was on head prior to noticing this.   ED Course:  In the emergency department, he was tachypneic and tachycardic, BP was 203/96, temperature on arrival was 99.23F and O2 sat was 96% on room air.  Work-up in the ED shows leukopenia and  normocytic anemia, BMP was normal except for hypokalemia and hyperglycemia, troponin x1 was 28, lactic acid 1.5, blood culture showed no growth in less than 12 hours. Chest x-ray showed no active disease CT angiography of chest with contrast showed no evidence for acute pulmonary embolism IV hydration was provided, aspirin 324 mg x 1 was given.  Hospitalist was asked to admit patient for further evaluation and management  #Acute febrile illness possibly related to tick bite Continue Tylenol as needed, Continue Doxycycline 100 po Bid for 10 days as per ID Lyme disease serology and [facility] spotted fever antibodies negative Urine culture grew Staph Lugdunensis, resistant to tetracycline so patient was prescribed nitrofurantoin twice daily for 3 days.  Patient had prolonged QTc so Cipro was avoided. #SIRS in setting of above Patient presents with tachypnea, tachycardia and leukopenia (met SIRS criteria).  Lactic acid was normal.  Resolved, patient's vital signs improved and remained stable. # Hypertensive urgency-resolved, Essential hypertension, resumed her home medications.  Patient was advised to monitor BP at home and follow with PCP for further management.  Patient does not wanted me to adjust medications # Hypokalemia, potassium was repleted and resolved.  It could be secondary to hydrochlorothiazide.  Patient was advised to follow-up with PCP for further management. #Leukopenia possibly reactive versus infectious WBC 3.3, continue treatment as described for acute febrile illness, wbc 4.3 improved  # Elevated troponin possibly secondary to type II demand ischemia. Troponin x1 - 28--39, patient denies chest pain, most likely due to hypertensive urgency.  Patient was advised to follow-up with PCP for possible stress test as an outpatient.  TTE showed LVEF 55 to 60%, no regional wall motion abnormality, grade 1 diastolic dysfunction. # Hyperglycemia possibly due to reactive process, CBG of 166,  patient has no history of type 2 diabetes.  Patient was advised to follow-up with PCP to recheck hemoglobin A1c # Prolonged QTc , 507 ms, unknown cause.  Repeat EKG showed QTc 439, patient was monitored on telemetry remained asymptomatic. Obesity (BMI 39.06 kg/m), Continue diet and lifestyle modification  Patient was ambulatory without any assistance. On the day of the discharge the patient's vitals were stable, and no other acute medical condition were reported by patient. the patient was felt safe to be discharge at Home.  Consultants: ID Procedures: None  Discharge Exam: General: Appear in no distress, no Rash; Oral Mucosa Clear, moist. Cardiovascular: S1 and S2 Present, no Murmur, Respiratory: normal respiratory effort, Bilateral Air entry present and no Crackles, no wheezes Abdomen: Bowel Sound present, Soft and no tenderness, no hernia Extremities: no Pedal edema, no calf tenderness Neurology: alert and oriented to time, place, and person affect appropriate.  Filed Weights   06/26/22 0311  Weight: 90.7 kg   Vitals:   06/26/22 2041 06/27/22 0522  BP: 130/78 122/83  Pulse: 81 74  Resp: 18 15  Temp: 98.3 F (36.8 C) 97.9 F (36.6 C)  SpO2: 96% 97%    DISCHARGE MEDICATION: Allergies as of 06/27/2022   No Known Allergies      Medication List     STOP taking these medications    tranexamic acid 650 MG Tabs tablet Commonly known as: Lysteda       TAKE these medications    aspirin 81 MG tablet Take 81 mg by mouth daily.   doxycycline 100 MG tablet Commonly known as: VIBRA-TABS Take 1 tablet (100 mg total) by mouth 2 (two) times daily for 21 days.   FISH OIL PO Take 1 capsule by mouth daily.   lisinopril-hydrochlorothiazide 20-25 MG tablet Commonly known as: ZESTORETIC Take 1 tablet by mouth daily. What changed: Another medication with the same name was removed. Continue taking this medication, and follow the directions you see here.   multivitamin with  minerals Tabs tablet Take 1 tablet by mouth daily.   oxyCODONE-acetaminophen 5-325 MG tablet Commonly known as: Percocet Take 2 tablets by mouth every 4 (four) hours as needed for pain.   Vascepa 1 g capsule Generic drug: icosapent Ethyl Take 2 capsules (2 g total) by mouth 2 (two) times daily with meal       No Known Allergies Discharge Instructions     Call MD for:  difficulty breathing, headache or visual disturbances   Complete by: As directed    Call MD for:  extreme fatigue   Complete by: As directed    Call MD for:  persistant dizziness or light-headedness   Complete by: As directed    Call MD for:  severe uncontrolled pain   Complete by: As directed    Call MD for:  temperature >100.4   Complete by: As directed    Diet - low sodium heart healthy   Complete by: As directed    Discharge instructions   Complete by: As directed    Follow-up with PCP in 1 week, continue to monitor BP at home and follow with PCP to titrate medications accordingly. Patient wanted to continue current home medications, advised to discontinue HCTZ due to hypokalemia but patient would  like to continue diet and follow with PCP. Hypertensive emergency with elevated troponin, 2D echocardiogram did not show any wall motion abnormality.  Diastolic grade 1 dysfunction, LVEF within normal limits.  May benefit from stress test as an outpatient and cardiology follow-up History of tick bite, patient was given doxycycline, Lyme and RMSF is still in process, patient was advised to follow-up on test reports and follow with PCP for further management as an outpatient.   Increase activity slowly   Complete by: As directed        The results of significant diagnostics from this hospitalization (including imaging, microbiology, ancillary and laboratory) are listed below for reference.    Significant Diagnostic Studies: ECHOCARDIOGRAM COMPLETE  Result Date: 06/27/2022    ECHOCARDIOGRAM REPORT   Patient Name:    Donna Washington [facility] Date of Exam: 06/27/2022 Medical Rec #:  [id number]               Height:       60.0 in Accession #:    [id number]              Weight:       200.0 lb Date of Birth:  [birth year]               BSA:          1.867 m Patient Age:    56 years                BP:           122/82 mmHg Patient Gender: F                       HR:           67 bpm. Exam Location:  [facility] Procedure: 2D Echo, Cardiac Doppler and Color Doppler Indications:    Elevated Troponin  History:        Patient has no prior history of Echocardiogram examinations.                 Risk Factors:Hypertension.  Sonographer:    Wenda Low Referring Phys: [place] Holiday  1. Left ventricular ejection fraction, by estimation, is 55 to 60%. The left ventricle has normal function. The left ventricle has no regional wall motion abnormalities. Left ventricular diastolic parameters are consistent with Grade I diastolic dysfunction (impaired relaxation).  2. Right ventricular systolic function is normal. The right ventricular size is normal. There is normal pulmonary artery systolic pressure. The estimated right ventricular systolic pressure is 06.3 mmHg.  3. The mitral valve is normal in structure. Trivial mitral valve regurgitation.  4. The aortic valve is tricuspid. Aortic valve regurgitation is trivial.  5. The inferior vena cava is normal in size with greater than 50% respiratory variability, suggesting right atrial pressure of 3 mmHg. Comparison(s): No prior Echocardiogram. FINDINGS  Left Ventricle: Left ventricular ejection fraction, by estimation, is 55 to 60%. The left ventricle has normal function. The left ventricle has no regional wall motion abnormalities. The left ventricular internal cavity size was normal in size. There is  no left ventricular hypertrophy. Left ventricular diastolic parameters are consistent with Grade I diastolic dysfunction (impaired relaxation). Right Ventricle: The right ventricular  size is normal. Right vetricular wall thickness was not well visualized. Right ventricular systolic function is normal. There is normal pulmonary artery systolic pressure. The tricuspid regurgitant velocity is 2.73 m/s, and with an assumed right atrial pressure of 3 mmHg, the  estimated right ventricular systolic pressure is 40.9 mmHg. Left Atrium: Left atrial size was normal in size. Right Atrium: Right atrial size was normal in size. Pericardium: There is no evidence of pericardial effusion. Mitral Valve: The mitral valve is normal in structure. Trivial mitral valve regurgitation. MV peak gradient, 6.1 mmHg. The mean mitral valve gradient is 2.0 mmHg. Tricuspid Valve: The tricuspid valve is normal in structure. Tricuspid valve regurgitation is trivial. Aortic Valve: The aortic valve is tricuspid. Aortic valve regurgitation is trivial. Aortic valve mean gradient measures 5.0 mmHg. Aortic valve peak gradient measures 9.7 mmHg. Aortic valve area, by VTI measures 2.06 cm. Pulmonic Valve: The pulmonic valve was normal in structure. Pulmonic valve regurgitation is not visualized. Aorta: The aortic root is normal in size and structure. Venous: The inferior vena cava is normal in size with greater than 50% respiratory variability, suggesting right atrial pressure of 3 mmHg. IAS/Shunts: The atrial septum is grossly normal.  LEFT VENTRICLE PLAX 2D LVIDd:         5.00 cm     Diastology LVIDs:         3.70 cm     LV e' medial:    7.51 cm/s LV PW:         1.00 cm     LV E/e' medial:  12.8 LV IVS:        0.90 cm     LV e' lateral:   8.59 cm/s LVOT diam:     1.90 cm     LV E/e' lateral: 11.2 LV SV:         74 LV SV Index:   40 LVOT Area:     2.84 cm  LV Volumes (MOD) LV vol d, MOD A2C: 54.4 ml LV vol d, MOD A4C: 66.0 ml LV vol s, MOD A2C: 22.6 ml LV vol s, MOD A4C: 32.1 ml LV SV MOD A2C:     31.8 ml LV SV MOD A4C:     66.0 ml LV SV MOD BP:      33.7 ml RIGHT VENTRICLE RV Basal diam:  3.40 cm RV S prime:     11.50 cm/s TAPSE  (M-mode): 2.0 cm LEFT ATRIUM             Index        RIGHT ATRIUM           Index LA diam:        3.80 cm 2.04 cm/m   RA Area:     14.10 cm LA Vol (A2C):   47.5 ml 25.45 ml/m  RA Volume:   35.10 ml  18.80 ml/m LA Vol (A4C):   75.1 ml 40.23 ml/m LA Biplane Vol: 62.8 ml 33.64 ml/m  AORTIC VALVE                     PULMONIC VALVE AV Area (Vmax):    2.24 cm      PV Vmax:       0.80 m/s AV Area (Vmean):   2.17 cm      PV Peak grad:  2.5 mmHg AV Area (VTI):     2.06 cm AV Vmax:           156.00 cm/s AV Vmean:          107.000 cm/s AV VTI:            0.360 m AV Peak Grad:      9.7 mmHg AV Mean Grad:  5.0 mmHg LVOT Vmax:         123.00 cm/s LVOT Vmean:        81.800 cm/s LVOT VTI:          0.262 m LVOT/AV VTI ratio: 0.73  AORTA Ao Root diam: 3.60 cm Ao Asc diam:  3.20 cm MITRAL VALVE                TRICUSPID VALVE MV Area (PHT): 3.65 cm     TR Peak grad:   29.8 mmHg MV Area VTI:   2.48 cm     TR Vmax:        273.00 cm/s MV Peak grad:  6.1 mmHg MV Mean grad:  2.0 mmHg     SHUNTS MV Vmax:       1.23 m/s     Systemic VTI:  0.26 m MV Vmean:      71.4 cm/s    Systemic Diam: 1.90 cm MV Decel Time: 208 msec MV E velocity: 96.00 cm/s MV A velocity: 108.00 cm/s MV E/A ratio:  0.89 Gwyndolyn Kaufman MD Electronically signed by Gwyndolyn Kaufman MD Signature Date/Time: 06/27/2022/11:31:35 AM    Final    CT Angio Chest Pulmonary Embolism (PE) W or WO Contrast  Result Date: 06/26/2022 CLINICAL DATA:  Shortness of breath.  Rule out pulmonary embolus. EXAM: CT ANGIOGRAPHY CHEST WITH CONTRAST TECHNIQUE: Multidetector CT imaging of the chest was performed using the standard protocol during bolus administration of intravenous contrast. Multiplanar CT image reconstructions and MIPs were obtained to evaluate the vascular anatomy. RADIATION DOSE REDUCTION: This exam was performed according to the departmental dose-optimization program which includes automated exposure control, adjustment of the mA and/or kV according to patient  size and/or use of iterative reconstruction technique. CONTRAST:  52m OMNIPAQUE IOHEXOL 350 MG/ML SOLN COMPARISON:  None Available. FINDINGS: Cardiovascular: Satisfactory opacification of the pulmonary arteries to the segmental level. No evidence of pulmonary embolism. Normal heart size. No pericardial effusion. Mediastinum/Nodes: No enlarged mediastinal, hilar, or axillary lymph nodes. Thyroid gland, trachea, and esophagus demonstrate no significant findings. Lungs/Pleura: There is no pleural effusion, airspace consolidation, atelectasis, or pneumothorax. No suspicious lung nodules. Upper Abdomen: No acute abnormality. Musculoskeletal: No acute or suspicious osseous findings. Review of the MIP images confirms the above findings. IMPRESSION: No evidence for acute pulmonary embolism. Electronically Signed   By: TKerby MoorsM.D.   On: 06/26/2022 05:22   DG Chest Port 1 View  Result Date: 06/26/2022 CLINICAL DATA:  Chest pain and shortness of breath EXAM: PORTABLE CHEST 1 VIEW COMPARISON:  09/19/2012 FINDINGS: The heart size and mediastinal contours are within normal limits. Both lungs are clear. The visualized skeletal structures are unremarkable. IMPRESSION: No active disease. Electronically Signed   By: MInez CatalinaM.D.   On: 06/26/2022 03:37    Microbiology: Recent Results (from the past 240 hour(s))  SARS Coronavirus 2 by RT PCR ([facility] *cepheid single result test* Anterior Nasal Swab     Status: None   Collection Time: 06/26/22  3:29 AM   Specimen: Anterior Nasal Swab  Result Value Ref Range Status   SARS Coronavirus 2 by RT PCR NEGATIVE NEGATIVE Final    Comment: (NOTE) SARS-CoV-2 target nucleic acids are NOT DETECTED.  The SARS-CoV-2 RNA is generally detectable in upper and lower respiratory specimens during the acute phase of infection. The lowest concentration of SARS-CoV-2 viral copies this assay can detect is 250 copies / mL. A negative  result  does not preclude SARS-CoV-2 infection and should not be used as the sole basis for treatment or other patient management decisions.  A negative result may occur with improper specimen collection / handling, submission of specimen other than nasopharyngeal swab, presence of viral mutation(s) within the areas targeted by this assay, and inadequate number of viral copies (<250 copies / mL). A negative result must be combined with clinical observations, patient history, and epidemiological information.  Fact Sheet for Patients:   https://www.patel.info/  Fact Sheet for Healthcare Providers: https://hall.com/  This test is not yet approved or  cleared by the [place] FDA and has been authorized for detection and/or diagnosis of SARS-CoV-2 by FDA under an Emergency Use Authorization (EUA).  This EUA will remain in effect (meaning this test can be used) for the duration of the COVID-19 declaration under Section 564(b)(1) of the Act, 21 U.S.C. section 360bbb-3(b)(1), unless the authorization is terminated or revoked sooner.  Performed at [facility], [address], [place], [place] [address]   Blood Culture (routine x 2)     Status: None (Preliminary result)   Collection Time: 06/26/22  4:22 AM   Specimen: Left Antecubital; Blood  Result Value Ref Range Status   Specimen Description LEFT ANTECUBITAL  Final   Special Requests   Final    BOTTLES DRAWN AEROBIC AND ANAEROBIC Blood Culture results may not be optimal due to an excessive volume of blood received in culture bottles   Culture   Final    NO GROWTH 2 DAYS Performed at [facility], [address] [place], [place] [address]    Report Status PENDING  Incomplete  Blood Culture (routine x 2)     Status: None (Preliminary result)   Collection Time: 06/26/22  4:26 AM   Specimen: BLOOD LEFT HAND  Result Value Ref Range Status   Specimen Description BLOOD LEFT HAND  Final    Special Requests   Final    BOTTLES DRAWN AEROBIC AND ANAEROBIC Blood Culture results may not be optimal due to an excessive volume of blood received in culture bottles   Culture   Final    NO GROWTH 2 DAYS Performed at [facility], [address] [place], [place] [address]    Report Status PENDING  Incomplete  Urine Culture     Status: Abnormal   Collection Time: 06/26/22  5:48 AM   Specimen: In/Out Cath Urine  Result Value Ref Range Status   Specimen Description   Final    IN/OUT CATH URINE Performed at [facility], [address] [place], [place] [address]    Special Requests   Final    NONE Performed at [facility], [address] [place],  [address]    Culture 8,000 COLONIES/mL STAPHYLOCOCCUS LUGDUNENSIS (A)  Final   Report Status 06/28/2022 FINAL  Final   Organism ID, Bacteria STAPHYLOCOCCUS LUGDUNENSIS (A)  Final      Susceptibility   Staphylococcus lugdunensis - MIC*    CIPROFLOXACIN <=0.5 SENSITIVE Sensitive     GENTAMICIN <=0.5 SENSITIVE Sensitive     NITROFURANTOIN <=16 SENSITIVE Sensitive     OXACILLIN 2 SENSITIVE Sensitive     TETRACYCLINE >=16 RESISTANT Resistant     VANCOMYCIN 1 SENSITIVE Sensitive     TRIMETH/SULFA <=10 SENSITIVE Sensitive     CLINDAMYCIN <=0.25 SENSITIVE Sensitive     RIFAMPIN <=0.5 SENSITIVE Sensitive     Inducible Clindamycin NEGATIVE Sensitive     * 8,000 COLONIES/mL STAPHYLOCOCCUS LUGDUNENSIS     Labs:  CBC: Recent Labs  Lab 06/26/22 0334 06/27/22 0437  WBC 3.3* 4.3  NEUTROABS 2.1  --   HGB 11.8* 10.6*  HCT 35.5* 31.7*  MCV 84.7 85.2  PLT 194 417   Basic Metabolic Panel: Recent Labs  Lab 06/26/22 0334 06/26/22 0615 06/27/22 0437  NA 137  --  138  K 3.1*  --  4.1  CL 106  --  107  CO2 23  --  25  GLUCOSE 166*  --  125*  BUN 11  --  10  CREATININE 0.87  --  0.84  CALCIUM 8.5*  --  8.5*  MG  --  1.7  --   PHOS  --  1.6* 5.9*   Liver Function Tests: Recent Labs  Lab 06/26/22 0334 06/27/22 0437  AST 38  32  ALT 41 43  ALKPHOS 81 69  BILITOT 0.3 0.5  PROT 7.3 6.7  ALBUMIN 3.6 3.2*   No results for input(s): "LIPASE", "AMYLASE" in the last 168 hours. No results for input(s): "AMMONIA" in the last 168 hours. Cardiac Enzymes: No results for input(s): "CKTOTAL", "CKMB", "CKMBINDEX", "TROPONINI" in the last 168 hours. BNP (last 3 results) No results for input(s): "BNP" in the last 8760 hours. CBG: No results for input(s): "GLUCAP" in the last 168 hours.  Time spent: 35 minutes  Signed:  Val Riles  [facility] 06/27/2022  3:37 PM

## 2022-07-02 LAB — CULTURE, BLOOD (ROUTINE X 2)
Culture: NO GROWTH
Culture: NO GROWTH

## 2022-07-08 ENCOUNTER — Other Ambulatory Visit (HOSPITAL_COMMUNITY): Payer: Self-pay

## 2022-07-09 DIAGNOSIS — R7309 Other abnormal glucose: Secondary | ICD-10-CM | POA: Diagnosis not present

## 2022-07-09 DIAGNOSIS — I10 Essential (primary) hypertension: Secondary | ICD-10-CM | POA: Diagnosis not present

## 2022-07-09 DIAGNOSIS — E782 Mixed hyperlipidemia: Secondary | ICD-10-CM | POA: Diagnosis not present

## 2022-07-09 DIAGNOSIS — W57XXXA Bitten or stung by nonvenomous insect and other nonvenomous arthropods, initial encounter: Secondary | ICD-10-CM | POA: Diagnosis not present

## 2022-07-24 ENCOUNTER — Other Ambulatory Visit (HOSPITAL_COMMUNITY): Payer: Self-pay

## 2022-07-24 MED ORDER — EPINEPHRINE 0.3 MG/0.3ML IJ SOAJ
INTRAMUSCULAR | 2 refills | Status: DC
  Filled 2022-07-24: qty 2, 14d supply, fill #0
  Filled 2023-01-26: qty 2, 14d supply, fill #1
  Filled 2023-04-26: qty 2, 14d supply, fill #2

## 2022-08-03 ENCOUNTER — Other Ambulatory Visit (HOSPITAL_COMMUNITY): Payer: Self-pay

## 2022-08-05 ENCOUNTER — Other Ambulatory Visit (HOSPITAL_COMMUNITY): Payer: Self-pay

## 2022-08-05 MED ORDER — LISINOPRIL-HYDROCHLOROTHIAZIDE 20-25 MG PO TABS
1.0000 | ORAL_TABLET | Freq: Every day | ORAL | 0 refills | Status: DC
  Filled 2022-08-05: qty 90, 90d supply, fill #0

## 2022-08-05 MED ORDER — ICOSAPENT ETHYL 1 G PO CAPS
2.0000 g | ORAL_CAPSULE | Freq: Two times a day (BID) | ORAL | 1 refills | Status: DC
  Filled 2022-08-05: qty 360, 90d supply, fill #0
  Filled 2022-10-28: qty 360, 90d supply, fill #1

## 2022-08-06 ENCOUNTER — Other Ambulatory Visit (HOSPITAL_COMMUNITY): Payer: Self-pay

## 2022-08-13 DIAGNOSIS — Z01419 Encounter for gynecological examination (general) (routine) without abnormal findings: Secondary | ICD-10-CM | POA: Diagnosis not present

## 2022-08-13 DIAGNOSIS — Z130 Encounter for screening for diseases of the blood and blood-forming organs and certain disorders involving the immune mechanism: Secondary | ICD-10-CM | POA: Diagnosis not present

## 2022-08-13 DIAGNOSIS — Z1389 Encounter for screening for other disorder: Secondary | ICD-10-CM | POA: Diagnosis not present

## 2022-08-14 ENCOUNTER — Other Ambulatory Visit: Payer: Self-pay | Admitting: Obstetrics and Gynecology

## 2022-08-14 DIAGNOSIS — Z1231 Encounter for screening mammogram for malignant neoplasm of breast: Secondary | ICD-10-CM

## 2022-09-06 ENCOUNTER — Ambulatory Visit
Admission: RE | Admit: 2022-09-06 | Discharge: 2022-09-06 | Disposition: A | Discharge status: Home / Self Care | Payer: 59 | Source: Ambulatory Visit | Attending: Obstetrics and Gynecology | Admitting: Obstetrics and Gynecology

## 2022-09-06 DIAGNOSIS — Z1231 Encounter for screening mammogram for malignant neoplasm of breast: Secondary | ICD-10-CM | POA: Diagnosis not present

## 2022-10-28 ENCOUNTER — Other Ambulatory Visit (HOSPITAL_COMMUNITY): Payer: Self-pay

## 2022-10-28 MED ORDER — LISINOPRIL-HYDROCHLOROTHIAZIDE 20-25 MG PO TABS
1.0000 | ORAL_TABLET | Freq: Every day | ORAL | 0 refills | Status: DC
  Filled 2022-10-28: qty 90, 90d supply, fill #0

## 2022-10-29 ENCOUNTER — Other Ambulatory Visit (HOSPITAL_COMMUNITY): Payer: Self-pay

## 2023-01-24 DIAGNOSIS — H5203 Hypermetropia, bilateral: Secondary | ICD-10-CM | POA: Diagnosis not present

## 2023-01-26 ENCOUNTER — Other Ambulatory Visit (HOSPITAL_COMMUNITY): Payer: Self-pay

## 2023-01-27 ENCOUNTER — Other Ambulatory Visit (HOSPITAL_COMMUNITY): Payer: Self-pay

## 2023-01-27 ENCOUNTER — Other Ambulatory Visit: Payer: Self-pay

## 2023-01-27 MED ORDER — ICOSAPENT ETHYL 1 G PO CAPS
2.0000 g | ORAL_CAPSULE | Freq: Two times a day (BID) | ORAL | 1 refills | Status: DC
  Filled 2023-01-27: qty 360, 90d supply, fill #0
  Filled 2023-04-26: qty 360, 90d supply, fill #1

## 2023-01-27 MED ORDER — LISINOPRIL-HYDROCHLOROTHIAZIDE 20-25 MG PO TABS
1.0000 | ORAL_TABLET | Freq: Every day | ORAL | 0 refills | Status: DC
  Filled 2023-01-27: qty 90, 90d supply, fill #0

## 2023-01-28 ENCOUNTER — Other Ambulatory Visit (HOSPITAL_COMMUNITY): Payer: Self-pay

## 2023-04-26 ENCOUNTER — Other Ambulatory Visit (HOSPITAL_COMMUNITY): Payer: Self-pay

## 2023-04-29 ENCOUNTER — Other Ambulatory Visit (HOSPITAL_COMMUNITY): Payer: Self-pay

## 2023-04-29 MED ORDER — LISINOPRIL-HYDROCHLOROTHIAZIDE 20-25 MG PO TABS
1.0000 | ORAL_TABLET | Freq: Every day | ORAL | 0 refills | Status: DC
  Filled 2023-04-29: qty 90, 90d supply, fill #0

## 2023-04-30 ENCOUNTER — Other Ambulatory Visit: Payer: Self-pay

## 2023-05-26 ENCOUNTER — Other Ambulatory Visit (HOSPITAL_COMMUNITY): Payer: Self-pay

## 2023-07-03 ENCOUNTER — Other Ambulatory Visit (HOSPITAL_COMMUNITY): Payer: Self-pay

## 2023-07-03 DIAGNOSIS — W57XXXA Bitten or stung by nonvenomous insect and other nonvenomous arthropods, initial encounter: Secondary | ICD-10-CM | POA: Diagnosis not present

## 2023-07-03 DIAGNOSIS — G4700 Insomnia, unspecified: Secondary | ICD-10-CM | POA: Diagnosis not present

## 2023-07-03 DIAGNOSIS — E782 Mixed hyperlipidemia: Secondary | ICD-10-CM | POA: Diagnosis not present

## 2023-07-03 DIAGNOSIS — S0006XD Insect bite (nonvenomous) of scalp, subsequent encounter: Secondary | ICD-10-CM | POA: Diagnosis not present

## 2023-07-03 DIAGNOSIS — R7309 Other abnormal glucose: Secondary | ICD-10-CM | POA: Diagnosis not present

## 2023-07-03 DIAGNOSIS — I10 Essential (primary) hypertension: Secondary | ICD-10-CM | POA: Diagnosis not present

## 2023-07-03 MED ORDER — TRAZODONE HCL 100 MG PO TABS
50.0000 mg | ORAL_TABLET | Freq: Every day | ORAL | 2 refills | Status: DC
  Filled 2023-07-03: qty 30, 30d supply, fill #0
  Filled 2023-10-29: qty 30, 30d supply, fill #1
  Filled 2024-02-02: qty 30, 30d supply, fill #2

## 2023-07-03 MED ORDER — ICOSAPENT ETHYL 1 G PO CAPS
2.0000 g | ORAL_CAPSULE | Freq: Two times a day (BID) | ORAL | 1 refills | Status: DC
  Filled 2023-07-03 – 2023-08-02 (×2): qty 360, 90d supply, fill #0
  Filled 2023-10-29: qty 360, 90d supply, fill #1

## 2023-07-03 MED ORDER — LISINOPRIL-HYDROCHLOROTHIAZIDE 20-25 MG PO TABS
1.0000 | ORAL_TABLET | Freq: Every day | ORAL | 0 refills | Status: DC
  Filled 2023-07-03 – 2023-08-04 (×3): qty 90, 90d supply, fill #0

## 2023-07-08 ENCOUNTER — Other Ambulatory Visit (HOSPITAL_COMMUNITY): Payer: Self-pay

## 2023-07-24 ENCOUNTER — Other Ambulatory Visit: Payer: Self-pay | Admitting: Obstetrics and Gynecology

## 2023-07-24 DIAGNOSIS — Z1231 Encounter for screening mammogram for malignant neoplasm of breast: Secondary | ICD-10-CM

## 2023-08-02 ENCOUNTER — Other Ambulatory Visit (HOSPITAL_COMMUNITY): Payer: Self-pay

## 2023-08-04 ENCOUNTER — Other Ambulatory Visit (HOSPITAL_COMMUNITY): Payer: Self-pay

## 2023-08-04 MED ORDER — EPINEPHRINE 0.3 MG/0.3ML IJ SOAJ
INTRAMUSCULAR | 2 refills | Status: DC
  Filled 2023-08-04: qty 2, 2d supply, fill #0
  Filled 2023-10-29: qty 2, 2d supply, fill #1
  Filled 2024-02-02: qty 2, 2d supply, fill #2

## 2023-08-19 DIAGNOSIS — R102 Pelvic and perineal pain: Secondary | ICD-10-CM | POA: Diagnosis not present

## 2023-08-19 DIAGNOSIS — Z01419 Encounter for gynecological examination (general) (routine) without abnormal findings: Secondary | ICD-10-CM | POA: Diagnosis not present

## 2023-08-19 DIAGNOSIS — N951 Menopausal and female climacteric states: Secondary | ICD-10-CM | POA: Diagnosis not present

## 2023-08-19 DIAGNOSIS — Z1389 Encounter for screening for other disorder: Secondary | ICD-10-CM | POA: Diagnosis not present

## 2023-08-19 DIAGNOSIS — Z130 Encounter for screening for diseases of the blood and blood-forming organs and certain disorders involving the immune mechanism: Secondary | ICD-10-CM | POA: Diagnosis not present

## 2023-09-08 ENCOUNTER — Ambulatory Visit
Admission: RE | Admit: 2023-09-08 | Discharge: 2023-09-08 | Disposition: A | Discharge status: Home / Self Care | Payer: Commercial Managed Care - PPO | Source: Ambulatory Visit | Attending: Obstetrics and Gynecology | Admitting: Obstetrics and Gynecology

## 2023-09-08 DIAGNOSIS — Z1231 Encounter for screening mammogram for malignant neoplasm of breast: Secondary | ICD-10-CM

## 2023-10-29 ENCOUNTER — Other Ambulatory Visit: Payer: Self-pay

## 2023-10-29 ENCOUNTER — Other Ambulatory Visit (HOSPITAL_COMMUNITY): Payer: Self-pay

## 2023-10-29 MED ORDER — LISINOPRIL-HYDROCHLOROTHIAZIDE 20-25 MG PO TABS
1.0000 | ORAL_TABLET | Freq: Every day | ORAL | 0 refills | Status: DC
  Filled 2023-10-29: qty 90, 90d supply, fill #0

## 2024-01-26 ENCOUNTER — Other Ambulatory Visit (HOSPITAL_COMMUNITY): Payer: Self-pay

## 2024-01-27 ENCOUNTER — Other Ambulatory Visit (HOSPITAL_COMMUNITY): Payer: Self-pay

## 2024-01-27 MED ORDER — ICOSAPENT ETHYL 1 G PO CAPS
2.0000 g | ORAL_CAPSULE | Freq: Two times a day (BID) | ORAL | 1 refills | Status: DC
  Filled 2024-01-27: qty 360, 90d supply, fill #0
  Filled 2024-04-27: qty 360, 90d supply, fill #1

## 2024-01-27 MED ORDER — LISINOPRIL-HYDROCHLOROTHIAZIDE 20-25 MG PO TABS
1.0000 | ORAL_TABLET | Freq: Every day | ORAL | 1 refills | Status: DC
  Filled 2024-01-27: qty 90, 90d supply, fill #0
  Filled 2024-04-27: qty 90, 90d supply, fill #1

## 2024-02-02 ENCOUNTER — Other Ambulatory Visit: Payer: Self-pay

## 2024-02-03 DIAGNOSIS — H5203 Hypermetropia, bilateral: Secondary | ICD-10-CM | POA: Diagnosis not present

## 2024-04-27 ENCOUNTER — Other Ambulatory Visit (HOSPITAL_COMMUNITY): Payer: Self-pay

## 2024-04-28 ENCOUNTER — Other Ambulatory Visit: Payer: Self-pay

## 2024-04-28 ENCOUNTER — Other Ambulatory Visit (HOSPITAL_COMMUNITY): Payer: Self-pay

## 2024-04-28 MED ORDER — EPINEPHRINE 0.3 MG/0.3ML IJ SOAJ
0.3000 mg | INTRAMUSCULAR | 2 refills | Status: AC | PRN
  Filled 2024-04-28: qty 2, 2d supply, fill #0

## 2024-06-10 DIAGNOSIS — N950 Postmenopausal bleeding: Secondary | ICD-10-CM | POA: Diagnosis not present

## 2024-06-10 DIAGNOSIS — N840 Polyp of corpus uteri: Secondary | ICD-10-CM | POA: Diagnosis not present

## 2024-06-29 DIAGNOSIS — N950 Postmenopausal bleeding: Secondary | ICD-10-CM | POA: Diagnosis not present

## 2024-06-29 DIAGNOSIS — D251 Intramural leiomyoma of uterus: Secondary | ICD-10-CM | POA: Diagnosis not present

## 2024-07-14 DIAGNOSIS — I10 Essential (primary) hypertension: Secondary | ICD-10-CM | POA: Diagnosis not present

## 2024-07-14 DIAGNOSIS — R7309 Other abnormal glucose: Secondary | ICD-10-CM | POA: Diagnosis not present

## 2024-07-14 DIAGNOSIS — E756 Lipid storage disorder, unspecified: Secondary | ICD-10-CM | POA: Diagnosis not present

## 2024-07-14 DIAGNOSIS — E782 Mixed hyperlipidemia: Secondary | ICD-10-CM | POA: Diagnosis not present

## 2024-07-21 ENCOUNTER — Other Ambulatory Visit (HOSPITAL_COMMUNITY): Payer: Self-pay

## 2024-07-21 MED ORDER — ICOSAPENT ETHYL 1 G PO CAPS
2.0000 g | ORAL_CAPSULE | Freq: Two times a day (BID) | ORAL | 1 refills | Status: AC
  Filled 2024-07-21: qty 360, 90d supply, fill #0

## 2024-07-24 ENCOUNTER — Other Ambulatory Visit (HOSPITAL_COMMUNITY): Payer: Self-pay

## 2024-07-27 ENCOUNTER — Other Ambulatory Visit (HOSPITAL_COMMUNITY): Payer: Self-pay

## 2024-07-27 MED ORDER — LISINOPRIL-HYDROCHLOROTHIAZIDE 20-25 MG PO TABS
1.0000 | ORAL_TABLET | Freq: Every day | ORAL | 1 refills | Status: AC
  Filled 2024-07-27: qty 90, 90d supply, fill #0
  Filled 2024-10-25: qty 90, 90d supply, fill #1

## 2024-08-31 ENCOUNTER — Encounter (HOSPITAL_COMMUNITY): Payer: Self-pay | Admitting: Student

## 2024-08-31 NOTE — Pre-Procedure Instructions (Signed)
 Surgical Instructions  Your procedure is scheduled on :  Wednesday,  09-08-2024 Report to [facility] Entrance A at 6:30  AM, then check in the Admitting office. Any questions or running late day of surgery :  call [phone]  Questions prior to your surgery day:  call [phone], Monday -- Friday 8am - 4pm. If you experience any cold or flu symptoms such as cough, fever, chills, shortness of breath, etc. between now and you scheduled surgery, please notify your surgeon office.   Remember: Do Not eat any food and Do Not drink any liquids after midnight the night before surgery.  This includes no water,  candy,  gum, and mints.  Take these medicines the morning of surgery with A SIPS OF WATER:   Omeprazole (prilosec)   May take these medicines IF NEEDED:  NONE   One week prior to surgery, STOP taking any Aspirin  (unless otherwise instructed by your surgeon) Aleve, Naproxen, ibuprofen, Motrin, Advil, Goody's, BC's, all herbal medications/ supplements, fish oil, and non-prescription vitamins.  Do NOT Smoke (tobacco/ vaping) and Do Not drink alcohol for 24 hours prior to your procedure.  For those patients that use a CPAP.  Please bring your CPAP/ mask/ tubing with them day of surgery .  You will be asked to removed any contacts, glasses, piercing's, hearing aid's, dentures/ partials prior to surgery.  Please bring cases/ container/ solution/ etc., for them day of surgery.   Patients discharged the day of surgery will NOT be allowed to drive home.  You must have responsible driver and caregiver to stay at home with you the next 24 hours.  SURGICAL WAITING ROOM VISITATION Patients may have no more than 2 support people in the waiting area - if more than 2 , these visitors may rotate.  Pre-op nurse will coordinate an appropriate time for 1 Adult support person, who may not rotate, to accompany patient in pre-op.  Aware some patients may have certain circumstances, speak to pre-op  nurse day of surgery.  Children under the age 35 must have an adult with them who is not the patient and must remain in the main waiting area with an adult.  If the patient needs to stay at the hospital during part of their recovery, the visitor guidelines for inpatient rooms apply.  Please refer to the  [facility] website for the visitor guidelines for any additional information.  If you received a COVID test during your pre-op visit it is requested that you wear a mask when out in public, stay away from anyone that may not be feeling well and notify your surgeon if you develop symptoms.  If you have been in contact with anyone that has tested positive in the past 10 days notify your surgeon.     [facility] - Preparing for Surgery  Before surgery, you can play an important role. Because skin is not sterile, it needs to be as free of germs as possible. You can reduce the number of germs on your skin by washing with CHG (chlorhexidine gluconate) soap before surgery. CHG is an antiseptic cleaner which kills germs and bonds with the skin to continue killing germs even after washing. Oral hygiene is also important in reducing the risk of infection. Remember to brush your teeth with your regular toothpaste the morning of surgery.  Please DO NOT use if you have an allergy to CHG or antibacterial soaps. If your skin becomes reddened/irritated stop using the CHG and inform your Pre-op nurse Day  of surgery.  DO NOT shave (including legs and genital area) for at least 48 hours prior to your CHG shower.   Please follow these instructions carefully:  Shower with CHG soap the night before surgery. If you choose to wash your hair, wash your hair first as usual with your normal shampoo. After you shampoo, rinse your hair and body thoroughly to remove the shampoo. Use CHG as you would any other liquid soap. You can apply CHG directly to the skin and wash gently with a clean washcloth or shower  sponge. Apply the CHG soap to your body ONLY FROM THE NECK DOWN. Do not use on open wounds or open sores. Avoid contact with your eyes, ears, mouth, and genitals (private parts). Wash genitals (private parts) with your normal soap. Wash thoroughly, paying special attention to the area where your surgery will be performed. Thoroughly rinse your body with warm water from the neck down. DO NOT shower/wash with your normal soap after using and rinsing off the CHG soap. DO NOT use lotions, oils, etc., after showering with CHG. Pat yourself dry with a clean towel. Wear clean pajamas. Place clean sheets on your bed the night of your CHG shower and do not sleep with pets.  Day of Surgery  DO NOT Apply any lotions,  powder,  oils,  deodorants (may use underarm deodorant),  cologne/  perfumes  or makeup Do Not wear jewelry /  piercing's/  metal/  permanent jewelry must be removed prior to arrival day of surgery. (No plastic piercing) Do Not wear nail polish,  gel polish,  artificial nails, or any other type of covering on natural finger nails (toe nails are okay) Remember to brush your teeth and rinse mouth out. Put on clean / comfortable clothes. [facility] is not responsible for valuables/ personal belongings

## 2024-08-31 NOTE — Progress Notes (Signed)
 Spoke w/ via phone for pre-op interview--- pt Lab needs dos---- ?upt order,  pt stated LMP 2022        Lab results------ lab appt 09-06-2024 @ 0930 (but coming @ 0830-0900) getting CBC/ BMP/ T&S/ EKG COVID test -----patient states asymptomatic no test needed Arrive at -------  0630 on 09-08-2024 NPO after MN w/ exception sips of water w/ meds Pre-Surgery Ensure or G2: n/a  Med rec completed Medications to take morning of surgery ----- prilosec Diabetic medication ----- n/a  GLP1 agonist last dose:  n/a GLP1 instructions:  Patient instructed no nail polish to be worn day of surgery Patient instructed to bring photo id and insurance card day of surgery Patient aware to have Driver (ride ) / caregiver    for 24 hours after surgery - husband, Donna Washington Patient Special Instructions ----- will pick up soap and written instructions at lab appt Pre-Op special Instructions -----  n/a  Patient verbalized understanding of instructions that were given at this phone interview. Patient denies chest pain, sob, fever, cough at the interview.

## 2024-09-06 ENCOUNTER — Encounter (HOSPITAL_COMMUNITY)
Admission: RE | Admit: 2024-09-06 | Discharge: 2024-09-06 | Disposition: A | Discharge status: Home / Self Care | Source: Ambulatory Visit | Attending: Student | Admitting: Student

## 2024-09-06 DIAGNOSIS — N840 Polyp of corpus uteri: Secondary | ICD-10-CM | POA: Diagnosis not present

## 2024-09-06 DIAGNOSIS — D251 Intramural leiomyoma of uterus: Secondary | ICD-10-CM | POA: Diagnosis not present

## 2024-09-06 DIAGNOSIS — N950 Postmenopausal bleeding: Secondary | ICD-10-CM | POA: Insufficient documentation

## 2024-09-06 DIAGNOSIS — Z01818 Encounter for other preprocedural examination: Secondary | ICD-10-CM | POA: Insufficient documentation

## 2024-09-06 LAB — TYPE AND SCREEN
ABO/RH(D): A POS
Antibody Screen: NEGATIVE

## 2024-09-06 LAB — CBC
HCT: 38.8 % (ref 36.0–46.0)
Hemoglobin: 12.9 g/dL (ref 12.0–15.0)
MCH: 28.5 pg (ref 26.0–34.0)
MCHC: 33.2 g/dL (ref 30.0–36.0)
MCV: 85.7 fL (ref 80.0–100.0)
Platelets: 298 K/uL (ref 150–400)
RBC: 4.53 MIL/uL (ref 3.87–5.11)
RDW: 13.7 % (ref 11.5–15.5)
WBC: 7.4 K/uL (ref 4.0–10.5)
nRBC: 0 % (ref 0.0–0.2)

## 2024-09-06 LAB — BASIC METABOLIC PANEL WITH GFR
Anion gap: 11 (ref 5–15)
BUN: 16 mg/dL (ref 6–20)
CO2: 28 mmol/L (ref 22–32)
Calcium: 8.9 mg/dL (ref 8.9–10.3)
Chloride: 98 mmol/L (ref 98–111)
Creatinine, Ser: 0.95 mg/dL (ref 0.44–1.00)
GFR, Estimated: >60 mL/min (ref >60)
Glucose, Bld: 170 mg/dL — ABNORMAL HIGH (ref 70–99)
Potassium: 2.9 mmol/L — ABNORMAL LOW (ref 3.5–5.1)
Sodium: 137 mmol/L (ref 135–145)

## 2024-09-07 NOTE — H&P (Signed)
 Donna Washington is an 58 y.o. female presenting for scheduled surgery.   Donna Washington was postmenopausal with no menses in 2 years. Before that she was having dysfunctional VB and had an appropriate w/u. Pt reports she started to bleed very lightly 05/22/24 and bled a total of 7 days turning into a full menstrual flow. She had some cramping and bloating as well. No VB since  Recent SIUS 06/10/24 with uterus anteflexed with multiple fibroids 3.9cm to 0.62 cm range SIUS with 2 polyps seen 2 cm mid anterior fundus and lower uterine segment posterior 2 cm. Uterus about 8-9 week size. EMB done but could only access LUS due to anteflexed uterus and was benign.  She had a prior SIUS/EMB in 6/23 and an endocervical polyp removed. She is wishing to proceed with definitive surgical treatment with hysterectomy.    Pertinent Gynecological History: Menses: post-menopausal Bleeding: post menopausal bleeding Blood transfusions: none Sexually transmitted diseases: no past history Previous GYN Procedures: SIUS/EMB  Last mammogram: normal Date: 09/08/2023 Last pap: normal Date: 07/10/2021 OB History: G1, P0010   Menstrual History: No LMP recorded. Patient is postmenopausal.    Past Medical History:  Diagnosis Date   Alpha galactosidase deficiency    Endometrial polyp    GERD (gastroesophageal reflux disease)    08-31-2024 occasional but does watch diet   Hyperlipidemia, mixed    Hypertension    Insomnia    PMB (postmenopausal bleeding)    Pre-diabetes    Uterine fibroid     Past Surgical History:  Procedure Laterality Date   MAXILLARY CYST EXCISION  10/29/2004   @[facility]  by Dr DOROTHA Ruth;   Right Globulomaxillary cyst excision    Family History  Problem Relation Age of Onset   Breast cancer Mother 76    Social History:  reports that she has never smoked. She has never used smokeless tobacco. She reports current alcohol use. She reports that she does not use drugs.  Allergies:  Allergies   Allergen Reactions   Alpha-Gal Hives and Rash    No medications prior to admission.    Review of Systems  Constitutional:  Negative for chills and fever.  Respiratory:  Negative for shortness of breath.   Cardiovascular:  Negative for chest pain.  Gastrointestinal:  Negative for abdominal pain and constipation.  Genitourinary:  Negative for pelvic pain and vaginal bleeding.  Neurological:  Negative for light-headedness and headaches.    Height 5' (1.524 m), weight 90.7 kg. Physical Exam Constitutional:      Appearance: She is obese.  HENT:     Head: Normocephalic and atraumatic.  Eyes:     Extraocular Movements: Extraocular movements intact.  Cardiovascular:     Rate and Rhythm: Normal rate.     Heart sounds: Normal heart sounds.  Abdominal:     General: There is no distension.     Palpations: Abdomen is soft.     Tenderness: There is no abdominal tenderness.  Musculoskeletal:        General: No swelling.  Skin:    General: Skin is warm and dry.  Neurological:     General: No focal deficit present.     Mental Status: She is alert. Mental status is at baseline.  Psychiatric:        Mood and Affect: Mood normal.        Behavior: Behavior normal.     No results found for this or any previous visit (from the past 24 hours).  No results found.  Assessment/Plan: 58 yo G1P0010 here for RA-TLH+BS, possible oophorectomy, GYN cystoscopy for definitive management of PMB in setting of uterine fibroids and endometrial polyp - Plan for ovarian preservation unless ovaries appear abnormal intraoperatively, in which case she desires oophorectomy of any abnormal appearing ovaries - Given that she is nulliparous, I am hoping that her uterus can be delivered vaginally without need for specimen morcellation within vagina as I do not want to open her uterus given the small chance of underlying hyperplasia/malignancy. We reviewed possible mini laparotomy for specimen removal if it  cannot be removed vaginally  - Ancef for surgical prophylaxis - Verify consent - To OR when ready   Larraine DELENA Sharps 09/07/2024, 7:16 PM

## 2024-09-07 NOTE — Anesthesia Preprocedure Evaluation (Addendum)
 Anesthesia Evaluation  Patient identified by MRN, date of birth, ID band Patient awake    Reviewed: Allergy & Precautions, H&P , NPO status , Patient's Chart, lab work & pertinent test results  Airway Mallampati: II  TM Distance: >3 FB Neck ROM: Full    Dental no notable dental hx. (+) Teeth Intact, Dental Advisory Given   Pulmonary neg pulmonary ROS   Pulmonary exam normal breath sounds clear to auscultation       Cardiovascular Exercise Tolerance: Good hypertension, Pt. on medications Normal cardiovascular exam Rhythm:Regular Rate:Normal     Neuro/Psych negative neurological ROS  negative psych ROS   GI/Hepatic negative GI ROS, Neg liver ROS,GERD  Medicated,,  Endo/Other  negative endocrine ROS    Renal/GU negative Renal ROS  negative genitourinary   Musculoskeletal negative musculoskeletal ROS (+)    Abdominal   Peds negative pediatric ROS (+)  Hematology negative hematology ROS (+)   Anesthesia Other Findings Prolonged QT interval  Reproductive/Obstetrics negative OB ROS                              Anesthesia Physical Anesthesia Plan  ASA: 2  Anesthesia Plan: General   Post-op Pain Management: Tylenol  PO (pre-op)*   Induction: Intravenous  PONV Risk Score and Plan: 3 and Ondansetron, Dexamethasone and Treatment may vary due to age or medical condition  Airway Management Planned: Oral ETT  Additional Equipment:   Intra-op Plan:   Post-operative Plan: Extubation in OR  Informed Consent: I have reviewed the patients History and Physical, chart, labs and discussed the procedure including the risks, benefits and alternatives for the proposed anesthesia with the patient or authorized representative who has indicated his/her understanding and acceptance.     Dental advisory given  Plan Discussed with: CRNA and Anesthesiologist  Anesthesia Plan Comments: (Discussed both  nerve block for pain relief post-op and GA; including NV, sore throat, dental injury, and pulmonary complications  2 IV's)         Anesthesia Quick Evaluation

## 2024-09-08 ENCOUNTER — Encounter (HOSPITAL_COMMUNITY): Payer: Self-pay | Admitting: Student

## 2024-09-08 ENCOUNTER — Ambulatory Visit (HOSPITAL_COMMUNITY): Payer: Self-pay | Admitting: Anesthesiology

## 2024-09-08 ENCOUNTER — Encounter (HOSPITAL_COMMUNITY): Admission: RE | Disposition: A | Payer: Self-pay | Source: Ambulatory Visit | Attending: Student

## 2024-09-08 ENCOUNTER — Other Ambulatory Visit: Payer: Self-pay

## 2024-09-08 ENCOUNTER — Ambulatory Visit (HOSPITAL_COMMUNITY)
Admission: RE | Admit: 2024-09-08 | Discharge: 2024-09-09 | Disposition: A | Discharge status: Home / Self Care | Source: Ambulatory Visit | Attending: Student | Admitting: Student

## 2024-09-08 DIAGNOSIS — D251 Intramural leiomyoma of uterus: Secondary | ICD-10-CM

## 2024-09-08 DIAGNOSIS — D259 Leiomyoma of uterus, unspecified: Secondary | ICD-10-CM | POA: Diagnosis not present

## 2024-09-08 DIAGNOSIS — I10 Essential (primary) hypertension: Secondary | ICD-10-CM | POA: Insufficient documentation

## 2024-09-08 DIAGNOSIS — E876 Hypokalemia: Secondary | ICD-10-CM

## 2024-09-08 DIAGNOSIS — N840 Polyp of corpus uteri: Secondary | ICD-10-CM

## 2024-09-08 DIAGNOSIS — K219 Gastro-esophageal reflux disease without esophagitis: Secondary | ICD-10-CM | POA: Diagnosis not present

## 2024-09-08 DIAGNOSIS — N838 Other noninflammatory disorders of ovary, fallopian tube and broad ligament: Secondary | ICD-10-CM | POA: Diagnosis not present

## 2024-09-08 DIAGNOSIS — N920 Excessive and frequent menstruation with regular cycle: Secondary | ICD-10-CM | POA: Diagnosis not present

## 2024-09-08 DIAGNOSIS — N8003 Adenomyosis of the uterus: Secondary | ICD-10-CM | POA: Diagnosis not present

## 2024-09-08 DIAGNOSIS — Z90710 Acquired absence of both cervix and uterus: Secondary | ICD-10-CM | POA: Diagnosis present

## 2024-09-08 DIAGNOSIS — Z01818 Encounter for other preprocedural examination: Secondary | ICD-10-CM

## 2024-09-08 DIAGNOSIS — N950 Postmenopausal bleeding: Secondary | ICD-10-CM | POA: Diagnosis present

## 2024-09-08 HISTORY — DX: Leiomyoma of uterus, unspecified: D25.9

## 2024-09-08 HISTORY — DX: Polyp of corpus uteri: N84.0

## 2024-09-08 HISTORY — DX: Prediabetes: R73.03

## 2024-09-08 HISTORY — DX: Other disorders of plasma-protein metabolism, not elsewhere classified: E88.09

## 2024-09-08 HISTORY — PX: ROBOTIC ASSISTED TOTAL HYSTERECTOMY WITH BILATERAL SALPINGO OOPHERECTOMY: SHX6086

## 2024-09-08 HISTORY — DX: Mixed hyperlipidemia: E78.2

## 2024-09-08 HISTORY — DX: Gastro-esophageal reflux disease without esophagitis: K21.9

## 2024-09-08 HISTORY — DX: Insomnia, unspecified: G47.00

## 2024-09-08 HISTORY — PX: CYSTOSCOPY: SHX5120

## 2024-09-08 HISTORY — DX: Postmenopausal bleeding: N95.0

## 2024-09-08 LAB — ABO/RH: ABO/RH(D): A POS

## 2024-09-08 LAB — POTASSIUM: Potassium: 3.1 mmol/L — ABNORMAL LOW (ref 3.5–5.1)

## 2024-09-08 SURGERY — HYSTERECTOMY, TOTAL, ROBOT-ASSISTED, LAPAROSCOPIC, WITH BILATERAL SALPINGO-OOPHORECTOMY
Anesthesia: General

## 2024-09-08 MED ORDER — HYDROMORPHONE HCL 1 MG/ML IJ SOLN
INTRAMUSCULAR | Status: DC | PRN
  Administered 2024-09-08: .5 mg via INTRAVENOUS

## 2024-09-08 MED ORDER — OXYCODONE HCL 5 MG PO TABS: 5.0000 mg | ORAL_TABLET | Freq: Once | ORAL | Status: DC | PRN

## 2024-09-08 MED ORDER — KETOROLAC TROMETHAMINE 30 MG/ML IJ SOLN
30.0000 mg | Freq: Four times a day (QID) | INTRAMUSCULAR | Status: DC
  Administered 2024-09-08 – 2024-09-09 (×3): 30 mg via INTRAVENOUS
  Filled 2024-09-08 (×3): qty 1

## 2024-09-08 MED ORDER — KETOROLAC TROMETHAMINE 30 MG/ML IJ SOLN
INTRAMUSCULAR | Status: AC
Start: 2024-09-08 — End: 2024-09-08
  Filled 2024-09-08: qty 1

## 2024-09-08 MED ORDER — DEXAMETHASONE SOD PHOSPHATE PF 10 MG/ML IJ SOLN
INTRAMUSCULAR | Status: DC | PRN
  Administered 2024-09-08: 10 mg via INTRAVENOUS

## 2024-09-08 MED ORDER — HYDROMORPHONE HCL 1 MG/ML IJ SOLN
INTRAMUSCULAR | Status: AC
  Filled 2024-09-08: qty 0.5

## 2024-09-08 MED ORDER — HYDROMORPHONE HCL 1 MG/ML IJ SOLN: 0.2000 mg | INTRAMUSCULAR | Status: DC | PRN

## 2024-09-08 MED ORDER — FLUORESCEIN SODIUM 10 % IV SOLN
INTRAVENOUS | Status: DC | PRN
  Administered 2024-09-08: 20 mg via INTRAVENOUS

## 2024-09-08 MED ORDER — SUGAMMADEX SODIUM 200 MG/2ML IV SOLN
INTRAVENOUS | Status: DC | PRN
  Administered 2024-09-08: 200 mg via INTRAVENOUS

## 2024-09-08 MED ORDER — LIDOCAINE 2% (20 MG/ML) 5 ML SYRINGE
INTRAMUSCULAR | Status: AC
  Filled 2024-09-08: qty 5

## 2024-09-08 MED ORDER — ORAL CARE MOUTH RINSE
15.0000 mL | Freq: Once | OROMUCOSAL | Status: AC
Start: 2024-09-08 — End: 2024-09-08

## 2024-09-08 MED ORDER — ACETAMINOPHEN 500 MG PO TABS
1000.0000 mg | ORAL_TABLET | Freq: Four times a day (QID) | ORAL | Status: DC
  Administered 2024-09-08 – 2024-09-09 (×3): 1000 mg via ORAL
  Filled 2024-09-08 (×3): qty 2

## 2024-09-08 MED ORDER — FENTANYL CITRATE (PF) 250 MCG/5ML IJ SOLN
INTRAMUSCULAR | Status: DC | PRN
  Administered 2024-09-08: 100 ug via INTRAVENOUS
  Administered 2024-09-08 (×3): 50 ug via INTRAVENOUS

## 2024-09-08 MED ORDER — ROCURONIUM BROMIDE 10 MG/ML (PF) SYRINGE
PREFILLED_SYRINGE | INTRAVENOUS | Status: DC | PRN
  Administered 2024-09-08: 70 mg via INTRAVENOUS
  Administered 2024-09-08: 20 mg via INTRAVENOUS

## 2024-09-08 MED ORDER — PROPOFOL 1000 MG/100ML IV EMUL
INTRAVENOUS | Status: AC
  Filled 2024-09-08: qty 100

## 2024-09-08 MED ORDER — OXYCODONE HCL 5 MG/5ML PO SOLN: 5.0000 mg | Freq: Once | ORAL | Status: DC | PRN

## 2024-09-08 MED ORDER — SCOPOLAMINE 1 MG/3DAYS TD PT72
MEDICATED_PATCH | TRANSDERMAL | Status: DC | PRN
  Administered 2024-09-08: 1 mg via TRANSDERMAL

## 2024-09-08 MED ORDER — DOCUSATE SODIUM 100 MG PO CAPS
100.0000 mg | ORAL_CAPSULE | Freq: Two times a day (BID) | ORAL | Status: DC
  Administered 2024-09-08 (×2): 100 mg via ORAL
  Filled 2024-09-08 (×2): qty 1

## 2024-09-08 MED ORDER — POTASSIUM CHLORIDE 2 MEQ/ML IV SOLN
INTRAVENOUS | Status: AC
  Filled 2024-09-08: qty 1000

## 2024-09-08 MED ORDER — DEXMEDETOMIDINE HCL IN NACL 80 MCG/20ML IV SOLN
INTRAVENOUS | Status: DC | PRN
  Administered 2024-09-08: 8 ug via INTRAVENOUS

## 2024-09-08 MED ORDER — LISINOPRIL 10 MG PO TABS: 20.0000 mg | ORAL_TABLET | Freq: Every day | ORAL | Status: DC

## 2024-09-08 MED ORDER — OMEPRAZOLE 20 MG PO CPDR: 20.0000 mg | DELAYED_RELEASE_CAPSULE | Freq: Every day | ORAL | Status: DC | PRN

## 2024-09-08 MED ORDER — IBUPROFEN 600 MG PO TABS: 600.0000 mg | ORAL_TABLET | Freq: Four times a day (QID) | ORAL | Status: DC

## 2024-09-08 MED ORDER — STERILE WATER FOR IRRIGATION IR SOLN
Status: DC | PRN
  Administered 2024-09-08: 1000 mL

## 2024-09-08 MED ORDER — METOCLOPRAMIDE HCL 5 MG/ML IJ SOLN
INTRAMUSCULAR | Status: DC | PRN
  Administered 2024-09-08: 5 mg via INTRAVENOUS

## 2024-09-08 MED ORDER — LACTATED RINGERS IV SOLN: INTRAVENOUS | Status: DC

## 2024-09-08 MED ORDER — ROCURONIUM BROMIDE 10 MG/ML (PF) SYRINGE
PREFILLED_SYRINGE | INTRAVENOUS | Status: AC
  Filled 2024-09-08: qty 10

## 2024-09-08 MED ORDER — FENTANYL CITRATE (PF) 250 MCG/5ML IJ SOLN
INTRAMUSCULAR | Status: AC
  Filled 2024-09-08: qty 5

## 2024-09-08 MED ORDER — CHLORHEXIDINE GLUCONATE 0.12 % MT SOLN
15.0000 mL | Freq: Once | OROMUCOSAL | Status: AC
  Administered 2024-09-08: 15 mL via OROMUCOSAL

## 2024-09-08 MED ORDER — SCOPOLAMINE 1 MG/3DAYS TD PT72
MEDICATED_PATCH | TRANSDERMAL | Status: AC
  Filled 2024-09-08: qty 1

## 2024-09-08 MED ORDER — MIDAZOLAM HCL 2 MG/2ML IJ SOLN
INTRAMUSCULAR | Status: DC | PRN
  Administered 2024-09-08: 2 mg via INTRAVENOUS

## 2024-09-08 MED ORDER — CEFAZOLIN SODIUM-DEXTROSE 2-4 GM/100ML-% IV SOLN
2.0000 g | INTRAVENOUS | Status: AC
  Administered 2024-09-08: 2 g via INTRAVENOUS

## 2024-09-08 MED ORDER — PROPOFOL 10 MG/ML IV BOLUS
INTRAVENOUS | Status: AC
  Filled 2024-09-08: qty 20

## 2024-09-08 MED ORDER — BUPIVACAINE HCL (PF) 0.25 % IJ SOLN
INTRAMUSCULAR | Status: AC
  Filled 2024-09-08: qty 30

## 2024-09-08 MED ORDER — KETOROLAC TROMETHAMINE 30 MG/ML IJ SOLN
INTRAMUSCULAR | Status: DC | PRN
Start: 2024-09-08 — End: 2024-09-08
  Administered 2024-09-08: 30 mg via INTRAVENOUS

## 2024-09-08 MED ORDER — MIDAZOLAM HCL 2 MG/2ML IJ SOLN
INTRAMUSCULAR | Status: AC
Start: 2024-09-08 — End: 2024-09-08
  Filled 2024-09-08: qty 2

## 2024-09-08 MED ORDER — POVIDONE-IODINE 10 % EX SWAB
2.0000 | Freq: Once | CUTANEOUS | Status: AC
  Administered 2024-09-08: 2 via TOPICAL

## 2024-09-08 MED ORDER — HYDROCHLOROTHIAZIDE 25 MG PO TABS: 25.0000 mg | ORAL_TABLET | Freq: Every day | ORAL | Status: DC

## 2024-09-08 MED ORDER — EPHEDRINE SULFATE-NACL 50-0.9 MG/10ML-% IV SOSY
PREFILLED_SYRINGE | INTRAVENOUS | Status: DC | PRN
Start: 2024-09-08 — End: 2024-09-08
  Administered 2024-09-08: 5 mg via INTRAVENOUS
  Administered 2024-09-08: 2.5 mg via INTRAVENOUS
  Administered 2024-09-08: 5 mg via INTRAVENOUS

## 2024-09-08 MED ORDER — PHENYLEPHRINE 80 MCG/ML (10ML) SYRINGE FOR IV PUSH (FOR BLOOD PRESSURE SUPPORT)
PREFILLED_SYRINGE | INTRAVENOUS | Status: AC
Start: 2024-09-08 — End: 2024-09-08
  Filled 2024-09-08: qty 10

## 2024-09-08 MED ORDER — ALBUMIN HUMAN 5 % IV SOLN: INTRAVENOUS | Status: DC | PRN

## 2024-09-08 MED ORDER — OXYCODONE HCL 5 MG PO TABS
5.0000 mg | ORAL_TABLET | ORAL | Status: DC | PRN
  Filled 2024-09-08: qty 1

## 2024-09-08 MED ORDER — BUPIVACAINE LIPOSOME 1.3 % IJ SUSP
INTRAMUSCULAR | Status: AC
  Filled 2024-09-08: qty 20

## 2024-09-08 MED ORDER — LIDOCAINE 2% (20 MG/ML) 5 ML SYRINGE
INTRAMUSCULAR | Status: DC | PRN
  Administered 2024-09-08: 60 mg via INTRAVENOUS

## 2024-09-08 MED ORDER — PROPOFOL 500 MG/50ML IV EMUL
INTRAVENOUS | Status: DC | PRN
  Administered 2024-09-08: 200 mg via INTRAVENOUS
  Administered 2024-09-08: 175 ug/kg/min via INTRAVENOUS

## 2024-09-08 MED ORDER — ICOSAPENT ETHYL 1 G PO CAPS
2.0000 g | ORAL_CAPSULE | Freq: Two times a day (BID) | ORAL | Status: DC
  Administered 2024-09-08: 2 g via ORAL
  Filled 2024-09-08 (×2): qty 2

## 2024-09-08 MED ORDER — BUPIVACAINE HCL (PF) 0.25 % IJ SOLN
INTRAMUSCULAR | Status: DC | PRN
  Administered 2024-09-08: 30 mL

## 2024-09-08 MED ORDER — PHENYLEPHRINE 80 MCG/ML (10ML) SYRINGE FOR IV PUSH (FOR BLOOD PRESSURE SUPPORT)
PREFILLED_SYRINGE | INTRAVENOUS | Status: DC | PRN
  Administered 2024-09-08: 80 ug via INTRAVENOUS
  Administered 2024-09-08: 40 ug via INTRAVENOUS

## 2024-09-08 MED ORDER — CEFAZOLIN SODIUM-DEXTROSE 2-4 GM/100ML-% IV SOLN
INTRAVENOUS | Status: AC
  Filled 2024-09-08: qty 100

## 2024-09-08 MED ORDER — LACTATED RINGERS IV SOLN
INTRAVENOUS | Status: DC
Start: 2024-09-08 — End: 2024-09-08

## 2024-09-08 MED ORDER — SODIUM CHLORIDE 0.9 % IR SOLN
Status: DC | PRN
  Administered 2024-09-08: 1000 mL via INTRAVESICAL
  Administered 2024-09-08: 1000 mL

## 2024-09-08 MED ORDER — SIMETHICONE 80 MG PO CHEW
80.0000 mg | CHEWABLE_TABLET | Freq: Four times a day (QID) | ORAL | Status: DC
  Administered 2024-09-08 (×2): 80 mg via ORAL
  Filled 2024-09-08 (×2): qty 1

## 2024-09-08 MED ORDER — METOCLOPRAMIDE HCL 5 MG/ML IJ SOLN
INTRAMUSCULAR | Status: AC
Start: 2024-09-08 — End: 2024-09-08
  Filled 2024-09-08: qty 2

## 2024-09-08 MED ORDER — CHLORHEXIDINE GLUCONATE 0.12 % MT SOLN
OROMUCOSAL | Status: AC
  Filled 2024-09-08: qty 15

## 2024-09-08 MED ORDER — LISINOPRIL-HYDROCHLOROTHIAZIDE 20-25 MG PO TABS
1.0000 | ORAL_TABLET | Freq: Every day | ORAL | Status: DC
Start: 2024-09-08 — End: 2024-09-08

## 2024-09-08 MED ORDER — EPHEDRINE 5 MG/ML INJ
INTRAVENOUS | Status: AC
  Filled 2024-09-08: qty 5

## 2024-09-08 MED ORDER — METOCLOPRAMIDE HCL 5 MG/ML IJ SOLN: 10.0000 mg | Freq: Four times a day (QID) | INTRAMUSCULAR | Status: DC | PRN

## 2024-09-08 MED ORDER — FENTANYL CITRATE (PF) 100 MCG/2ML IJ SOLN: 25.0000 ug | INTRAMUSCULAR | Status: DC | PRN

## 2024-09-08 MED ORDER — MEPERIDINE HCL 25 MG/ML IJ SOLN: 6.2500 mg | INTRAMUSCULAR | Status: DC | PRN

## 2024-09-08 SURGICAL SUPPLY — 68 items
BAG URINE DRAIN 2000ML AR STRL (UROLOGICAL SUPPLIES) IMPLANT
BLADE SURG 10 STRL SS (BLADE) IMPLANT
CATH FOLEY 3WAY 5CC 16FR (CATHETERS) ×3 IMPLANT
CLIP SUT LAPRA TY ABSORB (SUTURE) IMPLANT
COVER BACK TABLE 60X90IN (DRAPES) ×3 IMPLANT
COVER LIGHT HANDLE UNIVERSAL (MISCELLANEOUS) IMPLANT
COVER MAYO STAND STRL (DRAPES) ×3 IMPLANT
COVER TIP SHEARS 8 DVNC (MISCELLANEOUS) ×3 IMPLANT
DEFOGGER SCOPE WARM SEASHARP (MISCELLANEOUS) ×3 IMPLANT
DERMABOND ADVANCED .7 DNX12 (GAUZE/BANDAGES/DRESSINGS) ×3 IMPLANT
DERMABOND ADVANCED .7 DNX6 (GAUZE/BANDAGES/DRESSINGS) IMPLANT
DRAPE ARM DVNC X/XI (DISPOSABLE) ×12 IMPLANT
DRAPE COLUMN DVNC XI (DISPOSABLE) ×3 IMPLANT
DRAPE SURG IRRIG POUCH 19X23 (DRAPES) ×3 IMPLANT
DRAPE UTILITY XL STRL (DRAPES) ×3 IMPLANT
DRSG OPSITE POSTOP 4X8 (GAUZE/BANDAGES/DRESSINGS) IMPLANT
DURAPREP 26ML APPLICATOR (WOUND CARE) ×3 IMPLANT
ELECTRODE REM PT RTRN 9FT ADLT (ELECTROSURGICAL) ×3 IMPLANT
FORCEPS BPLR FENES DVNC XI (FORCEP) ×3 IMPLANT
FORCEPS PROGRASP DVNC XI (FORCEP) IMPLANT
GAUZE 4X4 16PLY ~~LOC~~+RFID DBL (SPONGE) IMPLANT
GLOVE BIOGEL M 7.0 STRL (GLOVE) IMPLANT
GLOVE BIOGEL PI IND STRL 6 (GLOVE) ×9 IMPLANT
GLOVE BIOGEL PI MICRO STRL 5.5 (GLOVE) ×9 IMPLANT
HOLDER FOLEY CATH W/STRAP (MISCELLANEOUS) IMPLANT
IRRIGATION SUCT STRKRFLW 2 WTP (MISCELLANEOUS) ×3 IMPLANT
KIT PINK PAD W/HEAD ARM REST (MISCELLANEOUS) ×3 IMPLANT
KIT TURNOVER KIT B (KITS) ×3 IMPLANT
LEGGING LITHOTOMY PAIR STRL (DRAPES) ×3 IMPLANT
MANIFOLD NEPTUNE II (INSTRUMENTS) ×3 IMPLANT
MANIPULATOR ADVINCU DEL 2.5 PL (MISCELLANEOUS) IMPLANT
MANIPULATOR ADVINCU DEL 3.0 PL (MISCELLANEOUS) IMPLANT
MANIPULATOR ADVINCU DEL 3.5 PL (MISCELLANEOUS) IMPLANT
MANIPULATOR ADVINCU DEL 4.0 PL (MISCELLANEOUS) IMPLANT
NDL DRIVE SUT CUT DVNC (INSTRUMENTS) IMPLANT
NDL INSUFFLATION 14GA 120MM (NEEDLE) ×3 IMPLANT
NEEDLE DRIVE SUT CUT DVNC (INSTRUMENTS) ×2 IMPLANT
NEEDLE INSUFFLATION 14GA 120MM (NEEDLE) ×2 IMPLANT
OBTURATOR OPTICALSTD 8 DVNC (TROCAR) ×3 IMPLANT
OCCLUDER COLPOPNEUMO (BALLOONS) IMPLANT
PACK ROBOT WH (CUSTOM PROCEDURE TRAY) ×3 IMPLANT
PACK ROBOTIC GOWN (GOWN DISPOSABLE) ×3 IMPLANT
PAD OB MATERNITY 11 LF (PERSONAL CARE ITEMS) ×3 IMPLANT
PENCIL SMOKE EVAC W/HOLSTER (ELECTROSURGICAL) IMPLANT
POWDER SURGICEL 3.0 GRAM (HEMOSTASIS) IMPLANT
SAVER CELL AAL HAEMONETICS (INSTRUMENTS) IMPLANT
SCISSORS LAP 5X45 EPIX DISP (ENDOMECHANICALS) IMPLANT
SCISSORS MNPLR CVD DVNC XI (INSTRUMENTS) ×3 IMPLANT
SEAL UNIV 5-12 XI (MISCELLANEOUS) ×9 IMPLANT
SET IRRIG Y TYPE TUR BLADDER L (SET/KITS/TRAYS/PACK) ×3 IMPLANT
SET TUBE SMOKE EVAC HIGH FLOW (TUBING) ×3 IMPLANT
SLEEVE SCD COMPRESS KNEE MED (STOCKING) ×3 IMPLANT
SLEEVE SURGEON STRL (DRAPES) IMPLANT
SOLN STERILE WATER 1000 ML (IV SOLUTION) ×2 IMPLANT
SOLN STERILE WATER BTL 1000 ML (IV SOLUTION) ×3 IMPLANT
SPIKE FLUID TRANSFER (MISCELLANEOUS) ×6 IMPLANT
SUT PDS AB 0 CT1 27 (SUTURE) IMPLANT
SUT VIC AB 2-0 CT1 36 (SUTURE) IMPLANT
SUT VIC AB 4-0 KS 27 (SUTURE) IMPLANT
SUT VIC AB 4-0 PS2 18 (SUTURE) ×3 IMPLANT
SUT VICRYL 0 UR6 27IN ABS (SUTURE) IMPLANT
SUT VLOC 180 0 9IN GS21 (SUTURE) IMPLANT
SYSTEM CONTND EXTRCTN KII BLLN (MISCELLANEOUS) IMPLANT
SYSTEM LAPSCP GELPORT 120MM (MISCELLANEOUS) IMPLANT
TIP ENDOSCOPIC SURGICEL (TIP) IMPLANT
TOWEL GREEN STERILE (TOWEL DISPOSABLE) ×3 IMPLANT
TROCAR Z-THREAD OPTICAL 5X100M (TROCAR) ×3 IMPLANT
UNDERPAD 30X36 HEAVY ABSORB (UNDERPADS AND DIAPERS) ×3 IMPLANT

## 2024-09-08 NOTE — Plan of Care (Signed)
  Problem: Education: Goal: Knowledge of General Education information will improve Description: Including pain rating scale, medication(s)/side effects and non-pharmacologic comfort measures Outcome: Progressing   Problem: Health Behavior/Discharge Planning: Goal: Ability to manage health-related needs will improve Outcome: Progressing   Problem: Clinical Measurements: Goal: Ability to maintain clinical measurements within normal limits will improve Outcome: Progressing Goal: Will remain free from infection Outcome: Progressing Goal: Diagnostic test results will improve Outcome: Progressing Goal: Respiratory complications will improve Outcome: Progressing Goal: Cardiovascular complication will be avoided Outcome: Progressing   Problem: Activity: Goal: Risk for activity intolerance will decrease Outcome: Progressing   Problem: Nutrition: Goal: Adequate nutrition will be maintained Outcome: Progressing   Problem: Coping: Goal: Level of anxiety will decrease Outcome: Progressing   Problem: Elimination: Goal: Will not experience complications related to bowel motility Outcome: Progressing Goal: Will not experience complications related to urinary retention Outcome: Progressing   Problem: Pain Managment: Goal: General experience of comfort will improve and/or be controlled Outcome: Progressing   Problem: Safety: Goal: Ability to remain free from injury will improve Outcome: Progressing   Problem: Skin Integrity: Goal: Risk for impaired skin integrity will decrease Outcome: Progressing   Problem: Education: Goal: Knowledge of the prescribed therapeutic regimen will improve Outcome: Progressing Goal: Understanding of sexual limitations or changes related to disease process or condition will improve Outcome: Progressing Goal: Individualized Educational Video(s) Outcome: Progressing   Problem: Self-Concept: Goal: Communication of feelings regarding changes in body  function or appearance will improve Outcome: Progressing   Problem: Skin Integrity: Goal: Demonstration of wound healing without infection will improve Outcome: Progressing   Problem: Education: Goal: Knowledge of the prescribed therapeutic regimen will improve Outcome: Progressing Goal: Understanding of sexual limitations or changes related to disease process or condition will improve Outcome: Progressing Goal: Individualized Educational Video(s) Outcome: Progressing   Problem: Self-Concept: Goal: Communication of feelings regarding changes in body function or appearance will improve Outcome: Progressing   Problem: Skin Integrity: Goal: Demonstration of wound healing without infection will improve Outcome: Progressing

## 2024-09-08 NOTE — Op Note (Signed)
 Operative Note  Date: 09/08/24  Preoperative Diagnosis: 1) Postmenopausal bleeding 2) Uterine leiomyoma 3) Endometrial polyp  Postoperative Diagnosis:  Procedure: Robotic-assisted total laparoscopic hysterectomy with bilateral salpingectomy, gel-port assisted mini-laparotomy, GYN cystoscopy  Surgeon: LOIS Sharps, DO Assist: K. Estelle. MD    An experienced assistant was required given the standard of surgical care given the complexity of the case and maternal body habitus.  This assistant was needed for exposure, dissection, suctioning, retraction, instrument exchange, and for overall help during the procedure.    Operative Findings:  1) Normal vulva 2) Long narrow vaginal vault 3) Nulliparous cervix 4) Anteverted uterus sounding to 8 cm. Uterus with multiple fibroids including two intramural fibroids 3 and 4 cm at fundus and a 3 cm anterior lower uterine segment fibroid. Uterus weighing 385g 5) Normal fallopian tubes bilaterally 6) Atrophic normal-appearing ovaries bilaterally. 7) Normal upper abdominal survey 8) Intact bladder dome with bilateral ureteral efflux seen  Specimens: Uterus, cervix, bilateral fallopian tubes EBL 100 IVF 1350 UOP 100   Surgical Risks:  The patient was informed of the risks and benefits of robotic-assisted laparoscopic hysterectomy with bilateral salpingectomy with possible minilaparotomy  Risks included, but were not limited to bleeding, infection, injury to the internal organs and tissues.  The patient was counseled extensively about the possibility of laparotomy if visualization was hampered.  The patient expressed understanding of all risks involved with the procedure. All questions were answered and the patient consented to the procedure.  Description of the Procedure:  The patient was taken to the operating room, where a time-out was performed to confirm the correct patient and correct procedure. The patient was administered preoperative  prophylactic IV antibiotics.  General anesthesia was induced.   The patient was then positioned on the operating table in the dorsal lithotomy position with the legs supported using stirrups. All pressure points were padded and a Bair Hugger was placed to maintain control of core body temperature.  The patient was prepped and draped in the usual sterile fashion.  Operative Technique:  Attention was directed to the perineum, where a Foley catheter was inserted into the bladder.   A bivalve speculum was inserted into the vagina.  The anterior lip of the cervix was visualized and grasped using a single-tooth tenaculum.  The cervix was adequately dilated for the introduction of the uterine manipulator to be inserted through the cervix.  Following placement of the uterine manipulator, the speculum was removed from the vagina. The tenaculum was removed from the cervix. Sterile gown and gloves were changed.  Attention was then turned to the abdomen where a horizontal incision was performed approximately 5 cm superior to the umbilicus.  The Veress needle was introduced into the peritoneum.  Placement of the Veress needle was confirmed using normal saline solution.  Pneumoperitoneum was then established with carbon dioxide.  The Veress needle was removed.  An 8-mm trocar and sleeve were inserted through the incision into the peritoneum. Laparoscopic visualization confirmed the intraperitoneal insertion of the port. Pneumoperitoneum was then established and maintained using carbon dioxide.  A 2nd horizontal incision was made 8 cm to the left of the midline, and an 8-mm trocar was introduced under direct visualization.  A 3rd small horizontal incision was made 8 cm to the right of the midline, and an 8-mm trocar was introduced under direct visualization. Finally, a 4th incision was made between the midline and right incisions for the introduction of a 5 mm port for assisting.  The abdomen was then thoroughly  inspected and there was no evidence of injury to the bowel, bladder, or vasculature.   The patient was placed in the Trendelenburg position. The robotic device was then docked. The uterus was then elevated. The fallopian tubes were elevated and the mesosalpinx cut from lateral to medical using monopolar scissors. The round ligaments were then cauterized and cut with monopolar scissors  The round ligament incisions were extended into the posterior leaf of the broad ligament.   Attention was then turned to the contralateral side where the identical procedure was performed.  The midline reflection of the bladder and peritoneum to the uterus was then freed by extending the incisions of the anterior leaf of the broad ligament using the monopolar scissors. The uterus was bulky near fundus, while the pelvic was very narrow making it difficult to manipulate for visualization during creation of the bladder flap. The bladder was separated from the lower uterine segment and upper cervix using careful sharp dissection with monopolar scissors.  There was also an anterior lower uterine segment fibroid that was difficult to maneuver around. Mobilization of the bladder away from the cervix and upper anterior vaginal wall was performed.  The posterior leaf of the broad ligament was cut on either side parallel with the lateral side of the uterus, down to the point of the uterosacral ligaments behind the cervix.  This allowed for exposure of the uterine vessels.  The uterine corpus was retracted to expose the uterine vessels.  Using the bipolar fenestrated grasper, the uterine vessels were bilaterally coagulated at the level of the internal os at right angles to the lower uterine segment. After adequate coagulation, the vessels were transected using monopolar scissors.  The uterus and cervix were then amputated from the vagina using monopolar electrocautery.  We attempted to removed the uterus from the vagina, but it was  not possible given the size of the specimen compared to the pelvic inlet. We then made the decision to bivalve the uterus robotically and re-attempt, however this was also unsuccessful. We made one final attempt to place the specimen within a bag and remove the specimen from vagina, however we were not able to deliver it.   At this time, we made the decision to perform a mini-laparotomy for specimen delivery. The instruments were removed from the abdomen and the robot undocked. A 5-6 cm skin incision was made 3 cm from pubic symphysis. Dissection was carried down to the fascia was Bovie electrocautery. The fascia was incised with the Bovie and cut laterally to both sides. The rectus were divided and the peritoneum entered bluntly. A mini Alexis retractor was placed to aid with visualization. The specimen was then brought to the anterior abdominal wall. The bag incised with scissors and the specimen removed without difficulty. It was then sent to pathology for permanent investigation. A gel port was then placed over the Alexis retractor.   The robot was redocked and the vaginal cuff was closed a 0 v-loc suture. Good hemostasis was noted.  The abdomen was irrigated and good hemostasis was confirmed.  The instruments were removed from the abdomen.  Pneumoperitoneum was evacuated.   The mini-laparotomy was then closed in a layered fashion. First the peritoneum and rectus were closed with 2-0 vicryl in a running fashion. The fascia was then closed with 0-vicryl in a running fashion. The skin incision was then closed with 4-0 monocryl in a running subcuticular fashion. The laparoscopic skin incisions were closed using 4-0 Vicryl in a subcuticular stitch. The incisions  were reinforced with Dermabond and a Honeycomb dressing was placed over the mini-laparotomy.   Attention was then turned to the perineum where the foley was removed and cytoscopy performed.   All needle, sponge, and instrument counts were noted  to be correct x2 at the end of the procedure.  The patient tolerated the procedure well and was transferred to the recovery room in stable condition.   CLAUDENE MORT

## 2024-09-08 NOTE — Interval H&P Note (Signed)
 History and Physical Interval Note:  09/08/2024 8:00 AM  Donna Washington  has presented today for surgery, with the diagnosis of postmenopausal bleeding, uterine leiomyoma.  The various methods of treatment have been discussed with the patient and family. After consideration of risks, benefits and other options for treatment, the patient has consented to  Procedure(s) with comments: HYSTERECTOMY, TOTAL, ROBOT-ASSISTED, LAPAROSCOPIC, WITH BILATERAL SALPINGO-OOPHORECTOMY (Bilateral) - possible oophorectomy as a surgical intervention.  The patient's history has been reviewed, patient examined, no change in status, stable for surgery.  I have reviewed the patient's chart and labs.  Questions were answered to the patient's satisfaction.    Pre-op labs significant for hypokalemia of 2.9. Repeat this AM 3.1. This was discussed with anesthesia team as well   Larraine DELENA Sharps

## 2024-09-08 NOTE — Anesthesia Procedure Notes (Signed)
 Procedure Name: Intubation Date/Time: 09/08/2024 8:33 AM  Performed by: Viviana Almarie DASEN, CRNAPre-anesthesia Checklist: Patient identified, Emergency Drugs available, Suction available and Patient being monitored Patient Re-evaluated:Patient Re-evaluated prior to induction Oxygen Delivery Method: Circle System Utilized Preoxygenation: Pre-oxygenation with 100% oxygen Induction Type: IV induction Ventilation: Mask ventilation without difficulty Laryngoscope Size: Mac and 3 Grade View: Grade I Tube type: Oral Tube size: 6.5 mm Number of attempts: 1 Airway Equipment and Method: Stylet and Oral airway Placement Confirmation: ETT inserted through vocal cords under direct vision, positive ETCO2 and breath sounds checked- equal and bilateral Secured at: 21 cm Tube secured with: Tape Dental Injury: Teeth and Oropharynx as per pre-operative assessment

## 2024-09-08 NOTE — Progress Notes (Addendum)
*   Day of Surgery * Procedure(s) (LRB): HYSTERECTOMY, TOTAL, ROBOT-ASSISTED, LAPAROSCOPIC, WITH BILATERAL SALPINGETOMY, MINI LAPAROTOMY GELPORT ASSISTED (Bilateral) CYSTOSCOPY (N/A)  Subjective: Patient reports no nausea or vomiting. She is currently eating. No pain. Has not ambulated or voided yet, no urge.    Objective: I have reviewed patient's vital signs and intake and output.  General: alert, cooperative, no distress, and obese Resp: aerating well, no distress GI: soft, laparoscopic incisions x 4 intact. Honeycomb clean and dry Vaginal Bleeding: none  Assessment: s/p Procedure(s) with comments: HYSTERECTOMY, TOTAL, ROBOT-ASSISTED, LAPAROSCOPIC, WITH BILATERAL SALPINGETOMY, MINI LAPAROTOMY GELPORT ASSISTED (Bilateral) - possible oophorectomy CYSTOSCOPY (N/A)  Plan: OOB Multimodal pain regimen  Trial of void IVF with K+ DC IVF after 0.5 L Plan to discharge home in AM  LOS: 0 days    Larraine DELENA Sharps, DO 09/08/2024, 5:00 PM

## 2024-09-08 NOTE — Transfer of Care (Signed)
 Immediate Anesthesia Transfer of Care Note  Patient: Donna Washington  Procedure(s) Performed: HYSTERECTOMY, TOTAL, ROBOT-ASSISTED, LAPAROSCOPIC, WITH BILATERAL SALPINGETOMY, MINI LAPAROTOMY GELPORT ASSISTED (Bilateral) CYSTOSCOPY  Patient Location: PACU  Anesthesia Type:General  Level of Consciousness: oriented, drowsy, and patient cooperative  Airway & Oxygen Therapy: Patient Spontanous Breathing and Patient connected to face mask oxygen  Post-op Assessment: Report given to RN, Post -op Vital signs reviewed and stable, Patient moving all extremities X 4, and Patient able to stick tongue midline  Post vital signs: Reviewed and stable  Last Vitals:  Vitals Value Taken Time  BP 116/77 09/08/24 13:24  Temp 97.5   Pulse 83 09/08/24 13:26  Resp 17 09/08/24 13:26  SpO2 99 % 09/08/24 13:26  Vitals shown include unfiled device data.  Last Pain:  Vitals:   09/08/24 0646  TempSrc: Oral  PainSc: 0-No pain      Patients Stated Pain Goal: 2 (09/08/24 9353)  Complications: No notable events documented.

## 2024-09-08 NOTE — Anesthesia Postprocedure Evaluation (Signed)
 Anesthesia Post Note  Patient: Donna Washington  Procedure(s) Performed: HYSTERECTOMY, TOTAL, ROBOT-ASSISTED, LAPAROSCOPIC, WITH BILATERAL SALPINGETOMY, MINI LAPAROTOMY GELPORT ASSISTED (Bilateral) CYSTOSCOPY     Patient location during evaluation: PACU Anesthesia Type: General Level of consciousness: awake and alert Pain management: pain level controlled Vital Signs Assessment: post-procedure vital signs reviewed and stable Respiratory status: spontaneous breathing, nonlabored ventilation, respiratory function stable and patient connected to nasal cannula oxygen Cardiovascular status: blood pressure returned to baseline and stable Postop Assessment: no apparent nausea or vomiting Anesthetic complications: no   No notable events documented.  Last Vitals:  Vitals:   09/08/24 2017 09/08/24 2021  BP:    Pulse:    Resp:    Temp:    SpO2: 96% 95%    Last Pain:  Vitals:   09/08/24 2013  TempSrc:   PainSc: 0-No pain   Pain Goal: Patients Stated Pain Goal: 2 (09/08/24 1324)                 Aza Dantes

## 2024-09-09 ENCOUNTER — Other Ambulatory Visit (HOSPITAL_COMMUNITY): Payer: Self-pay

## 2024-09-09 ENCOUNTER — Encounter (HOSPITAL_COMMUNITY): Payer: Self-pay | Admitting: Student

## 2024-09-09 DIAGNOSIS — N950 Postmenopausal bleeding: Secondary | ICD-10-CM | POA: Diagnosis not present

## 2024-09-09 LAB — POTASSIUM: Potassium: 3.5 mmol/L (ref 3.5–5.1)

## 2024-09-09 MED ORDER — OXYCODONE HCL 5 MG PO TABS
5.0000 mg | ORAL_TABLET | ORAL | 0 refills | Status: AC | PRN
  Filled 2024-09-09: qty 18, 3d supply, fill #0

## 2024-09-09 MED ORDER — POLYETHYLENE GLYCOL 3350 17 GM/SCOOP PO POWD: 17.0000 g | Freq: Every day | ORAL | Status: AC

## 2024-09-09 MED ORDER — IBUPROFEN 800 MG PO TABS
800.0000 mg | ORAL_TABLET | Freq: Three times a day (TID) | ORAL | 0 refills | Status: AC
  Filled 2024-09-09: qty 30, 10d supply, fill #0

## 2024-09-09 MED ORDER — SIMETHICONE 80 MG PO CHEW
80.0000 mg | CHEWABLE_TABLET | Freq: Three times a day (TID) | ORAL | 0 refills | Status: AC
  Filled 2024-09-09: qty 21, 7d supply, fill #0

## 2024-09-09 MED ORDER — ACETAMINOPHEN 500 MG PO TABS
1000.0000 mg | ORAL_TABLET | Freq: Three times a day (TID) | ORAL | 0 refills | Status: AC
  Filled 2024-09-09: qty 30, 5d supply, fill #0

## 2024-09-09 NOTE — Progress Notes (Signed)
 1 Day Post-Op Procedure(s) (LRB): HYSTERECTOMY, TOTAL, ROBOT-ASSISTED, LAPAROSCOPIC, WITH BILATERAL SALPINGETOMY, MINI LAPAROTOMY GELPORT ASSISTED (Bilateral) CYSTOSCOPY (N/A)  Subjective: Seen ambulating in room this AM. Pain minimal, has not required narcotics. Urinating, passing flatus. Tolerating PO.  Objective: I have reviewed patient's vital signs, intake and output, medications, and labs.  General: alert, cooperative, no distress Resp: aerating well, no distress GI: abdomen mildly distended. laparoscopic incisions x 4 clean and dry. Honeycomb clean and dry Ext: no significant edema  Assessment: s/p Procedure(s) with comments: HYSTERECTOMY, TOTAL, ROBOT-ASSISTED, LAPAROSCOPIC, WITH BILATERAL SALPINGETOMY, MINI LAPAROTOMY GELPORT ASSISTED (Bilateral) - possible oophorectomy CYSTOSCOPY (N/A): stable and progressing well Hypokalemia, resolved after repletion  Plan: DC home  Will have f/u in office in 2 weeks  LOS: 0 days    Larraine DELENA Sharps, DO 09/09/2024, 6:41 AM

## 2024-09-09 NOTE — Plan of Care (Signed)
  Problem: Education: Goal: Knowledge of General Education information will improve Description: Including pain rating scale, medication(s)/side effects and non-pharmacologic comfort measures Outcome: Progressing   Problem: Health Behavior/Discharge Planning: Goal: Ability to manage health-related needs will improve Outcome: Progressing   Problem: Clinical Measurements: Goal: Ability to maintain clinical measurements within normal limits will improve Outcome: Progressing Goal: Will remain free from infection Outcome: Progressing Goal: Diagnostic test results will improve Outcome: Progressing Goal: Respiratory complications will improve Outcome: Progressing Goal: Cardiovascular complication will be avoided Outcome: Progressing   Problem: Activity: Goal: Risk for activity intolerance will decrease Outcome: Progressing   Problem: Nutrition: Goal: Adequate nutrition will be maintained Outcome: Progressing   Problem: Coping: Goal: Level of anxiety will decrease Outcome: Progressing   Problem: Elimination: Goal: Will not experience complications related to bowel motility Outcome: Progressing Goal: Will not experience complications related to urinary retention Outcome: Progressing   Problem: Pain Managment: Goal: General experience of comfort will improve and/or be controlled Outcome: Progressing   Problem: Safety: Goal: Ability to remain free from injury will improve Outcome: Progressing   Problem: Skin Integrity: Goal: Risk for impaired skin integrity will decrease Outcome: Progressing   Problem: Education: Goal: Knowledge of the prescribed therapeutic regimen will improve Outcome: Progressing Goal: Understanding of sexual limitations or changes related to disease process or condition will improve Outcome: Progressing Goal: Individualized Educational Video(s) Outcome: Progressing   Problem: Self-Concept: Goal: Communication of feelings regarding changes in body  function or appearance will improve Outcome: Progressing   Problem: Skin Integrity: Goal: Demonstration of wound healing without infection will improve Outcome: Progressing   Problem: Education: Goal: Knowledge of the prescribed therapeutic regimen will improve Outcome: Progressing Goal: Understanding of sexual limitations or changes related to disease process or condition will improve Outcome: Progressing Goal: Individualized Educational Video(s) Outcome: Progressing   Problem: Self-Concept: Goal: Communication of feelings regarding changes in body function or appearance will improve Outcome: Progressing   Problem: Skin Integrity: Goal: Demonstration of wound healing without infection will improve Outcome: Progressing

## 2024-09-10 LAB — SURGICAL PATHOLOGY

## 2024-10-26 ENCOUNTER — Other Ambulatory Visit (HOSPITAL_COMMUNITY): Payer: Self-pay

## 2024-11-26 ENCOUNTER — Other Ambulatory Visit (HOSPITAL_COMMUNITY): Payer: Self-pay

## 2024-11-27 ENCOUNTER — Other Ambulatory Visit (HOSPITAL_COMMUNITY): Payer: Self-pay
